# Patient Record
Sex: Female | Born: 1985
Health system: Southern US, Community
[De-identification: ages and names within clinical notes are randomized; demographics above are authoritative.]

## PROBLEM LIST (undated history)

## (undated) DIAGNOSIS — R739 Hyperglycemia, unspecified: Secondary | ICD-10-CM

## (undated) DIAGNOSIS — I10 Essential (primary) hypertension: Secondary | ICD-10-CM

## (undated) HISTORY — PX: NO PAST SURGERIES: SHX2092

## (undated) HISTORY — DX: Hyperglycemia, unspecified: R73.9

---

## 2018-12-26 ENCOUNTER — Other Ambulatory Visit: Payer: Self-pay

## 2018-12-26 ENCOUNTER — Ambulatory Visit (INDEPENDENT_AMBULATORY_CARE_PROVIDER_SITE_OTHER): Payer: No Typology Code available for payment source | Admitting: Family

## 2018-12-26 ENCOUNTER — Encounter: Payer: Self-pay | Admitting: Family

## 2018-12-26 VITALS — Ht 66.0 in | Wt 160.0 lb

## 2018-12-26 DIAGNOSIS — Z3169 Encounter for other general counseling and advice on procreation: Secondary | ICD-10-CM

## 2018-12-26 DIAGNOSIS — R0789 Other chest pain: Secondary | ICD-10-CM | POA: Diagnosis not present

## 2018-12-26 DIAGNOSIS — R030 Elevated blood-pressure reading, without diagnosis of hypertension: Secondary | ICD-10-CM | POA: Diagnosis not present

## 2018-12-26 NOTE — Progress Notes (Signed)
Subjective:    Patient ID: Angela Pollard, female    DOB: 02-Sep-1985, 33 y.o.   MRN: [id number]  HPI   Virtual Visit via Video Note  I connected with Angela Pollard on 12/26/18 at  9:40 AM EDT by a video enabled telemedicine application and verified that I am speaking with the correct person using two identifiers.  Location: Patient: home Provider: office   I discussed the limitations of evaluation and management by telemedicine and the availability of in person appointments. The patient expressed understanding and agreed to proceed.    Patient is a 33 yr old female who presents today to establish care.   She has several concerns today:  Elevated blood pressure- She reports that her bp was high when she checked it at work a while back. Reports + family history of hypertension and she reports that she is ready to take it "more seriously." She has not checked her blood pressure recently.   Also would like to discuss preconception counseling.  She and her partner are considering pregnancy.  She has one son who is 34 years old.  [id number]  No pregnancy complications during her first pregnancy.   Reports that she has occasional chest discomfort. Reports that it last occurred 3-4 days ago. When she experiences the chest discomfort she is usually at rest. It is not worsened by activity. She notes some increased windedness with activity recently but she attributes this to being out of shape and to her recent weight gain. She denies any calf pain/swelling or risk factors for DVT/PE.   Review of Systems See HPI  History reviewed. No pertinent past medical history.   Social History   Socioeconomic History  . Marital status: Unknown    Spouse name: Not on file  . Number of children: Not on file  . Years of education: Not on file  . Highest education level: Not on file  Occupational History  . Not on file  Social Needs  . Financial resource strain: Not on file  . Food insecurity    Worry: Not on file    Inability: Not on file  . Transportation needs    Medical: Not on file    Non-medical: Not on file  Tobacco Use  . Smoking status: Former Smoker    Packs/day: 0.30    Years: 8.00    Pack years: 2.40  Substance and Sexual Activity  . Alcohol use: Not on file    Comment: occasional  . Drug use: Never  . Sexual activity: Yes    Partners: Male  Lifestyle  . Physical activity    Days per week: Not on file    Minutes per session: Not on file  . Stress: Not on file  Relationships  . Social Musicianconnections    Talks on phone: Not on file    Gets together: Not on file    Attends religious service: Not on file    Active member of club or organization: Not on file    Attends meetings of clubs or organizations: Not on file    Relationship status: Not on file  . Intimate partner violence    Fear of current or ex partner: Not on file    Emotionally abused: Not on file    Physically abused: Not on file    Forced sexual activity: Not on file  Other Topics Concern  . 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 No pertinent surgical history.  Family History  Problem Relation Age of Onset  . Brain cancer Mother 25  . Kidney failure Father   . CVA Father   . Hypertension Father   . Diabetes Mellitus II Father   . Hyperlipidemia Father   . Diabetes Mellitus II Half-Sister   . Heart failure Maternal Aunt     Not on File  No current outpatient medications on file prior to visit.   No current facility-administered medications on file prior to visit.     Ht 5\' 6"  (1.676 m)   Wt 160 lb (72.6 kg)   BMI 25.82 kg/m       Objective:   Physical Exam  Gen: Awake, alert, no acute distress Resp: Breathing is even and non-labored Psych:  calm/pleasant demeanor Neuro: Alert and Oriented x 3, + facial symmetry, speech is clear.        Assessment & Plan:  Preconception counseling- I advised her to add a daily prenatal vitamin, ensure healthy diet and regular exercise. I advised her that I would like to check her bp and make sure bp is stable before she considers pregnancy.  Elevated blood pressure- will plan to check in the office next week. If high, plan to initiate labetalol due to plans for conception. Discussed modest weight loss of 10-15 pounds.   Atypical chest pain- plan to perform EKG when she comes into the office. In the meantime she is advised to go to the ER if she develops recurrent/worsening chest pain.   20 minutes spent on today's video visit. >50% of this time was spent counseling pt on preconception counseling, HTN and atypical chest pain.

## 2019-01-02 ENCOUNTER — Ambulatory Visit (INDEPENDENT_AMBULATORY_CARE_PROVIDER_SITE_OTHER): Payer: No Typology Code available for payment source | Admitting: Family

## 2019-01-02 ENCOUNTER — Other Ambulatory Visit (HOSPITAL_COMMUNITY)
Admission: RE | Admit: 2019-01-02 | Discharge: 2019-01-02 | Disposition: A | Discharge status: Home / Self Care | Payer: No Typology Code available for payment source | Source: Ambulatory Visit | Attending: Family | Admitting: Family

## 2019-01-02 ENCOUNTER — Other Ambulatory Visit: Payer: Self-pay

## 2019-01-02 ENCOUNTER — Encounter: Payer: Self-pay | Admitting: Family

## 2019-01-02 VITALS — BP 104/101 | HR 61 | Temp 98.2°F | Resp 16 | Ht 66.0 in | Wt 156.0 lb

## 2019-01-02 DIAGNOSIS — Z01419 Encounter for gynecological examination (general) (routine) without abnormal findings: Secondary | ICD-10-CM

## 2019-01-02 DIAGNOSIS — Z0000 Encounter for general adult medical examination without abnormal findings: Secondary | ICD-10-CM

## 2019-01-02 DIAGNOSIS — I10 Essential (primary) hypertension: Secondary | ICD-10-CM

## 2019-01-02 DIAGNOSIS — R0789 Other chest pain: Secondary | ICD-10-CM | POA: Diagnosis not present

## 2019-01-02 LAB — COMPREHENSIVE METABOLIC PANEL
ALT: 10 U/L (ref 0–35)
AST: 13 U/L (ref 0–37)
Albumin: 4 g/dL (ref 3.5–5.2)
Alkaline Phosphatase: 62 U/L (ref 39–117)
BUN: 18 mg/dL (ref 6–23)
CO2: 30 mEq/L (ref 19–32)
Calcium: 8.9 mg/dL (ref 8.4–10.5)
Chloride: 103 mEq/L (ref 96–112)
Creatinine, Ser: 0.75 mg/dL (ref 0.40–1.20)
GFR: 88.65 mL/min (ref >60.00)
Glucose, Bld: 98 mg/dL (ref 70–99)
Potassium: 4.3 mEq/L (ref 3.5–5.1)
Sodium: 138 mEq/L (ref 135–145)
Total Bilirubin: 0.4 mg/dL (ref 0.2–1.2)
Total Protein: 6.9 g/dL (ref 6.0–8.3)

## 2019-01-02 LAB — CBC
HCT: 39.6 % (ref 36.0–46.0)
Hemoglobin: 13 g/dL (ref 12.0–15.0)
MCHC: 32.8 g/dL (ref 30.0–36.0)
MCV: 88.2 fl (ref 78.0–100.0)
Platelets: 314 10*3/uL (ref 150.0–400.0)
RBC: 4.49 Mil/uL (ref 3.87–5.11)
RDW: 13.6 % (ref 11.5–15.5)
WBC: 6.5 10*3/uL (ref 4.0–10.5)

## 2019-01-02 LAB — TSH: TSH: 1.62 u[IU]/mL (ref 0.35–4.50)

## 2019-01-02 LAB — LIPID PANEL
Cholesterol: 197 mg/dL (ref 0–200)
HDL: 60.2 mg/dL (ref >39.00)
LDL Cholesterol: 126 mg/dL — ABNORMAL HIGH (ref 0–99)
NonHDL: 136.67
Total CHOL/HDL Ratio: 3
Triglycerides: 54 mg/dL (ref 0.0–149.0)
VLDL: 10.8 mg/dL (ref 0.0–40.0)

## 2019-01-02 MED ORDER — LABETALOL HCL 100 MG PO TABS: 100.0000 mg | ORAL_TABLET | Freq: Two times a day (BID) | ORAL | 1 refills | Status: DC

## 2019-01-02 NOTE — Progress Notes (Signed)
Subjective:    Patient ID: Angela Pollard, female    DOB: 1986-05-10, 33 y.o.   MRN: [id number]  HPI  Patient presents today for complete physical.  Immunizations: reports last tetanus shot was given around 2011 Diet: reports diet is healthy Exercise: not exercising as much as she would like Pap Smear: last pap was 3 years ago Dental: due will schedule Vision: due, will schedule  Atypical chest pain- reports that she has intermittent chest pain.  Sometimes in to the right upper back.  Sometimes on the left-hand side.  Describes a "random pain." "moves around."    HTN-she is not currently on an antihypertensive.  She does have family history of hypertension. BP Readings from Last 3 Encounters:  01/02/19 (!) 104/101   She is considering pregnancy.  Review of Systems  Constitutional: Negative for unexpected weight change.  HENT: Negative for rhinorrhea.   Respiratory: Negative for cough and shortness of breath.   Cardiovascular: Negative for leg swelling.       See HPI  Gastrointestinal: Negative for constipation and diarrhea.  Genitourinary: Negative for dysuria, frequency and hematuria.  Musculoskeletal: Negative for arthralgias and myalgias.  Neurological: Negative for headaches.  Hematological: Negative for adenopathy.  Psychiatric/Behavioral:       Denies depression/anxiety   No past medical history on file.   Social History   Socioeconomic History  . Marital status: Unknown    Spouse name: Not on file  . Number of children: Not on file  . Years of education: Not on file  . Highest education level: Not on file  Occupational History  . Not on file  Social Needs  . Financial resource strain: Not on file  . Food insecurity    Worry: Not on file    Inability: Not on file  . Transportation needs    Medical: Not on file    Non-medical: Not on file  Tobacco Use  . Smoking status: Former Smoker    Packs/day: 0.30    Years: 8.00    Pack years: 2.40  Substance  and Sexual Activity  . Alcohol use: Not on file    Comment: occasional  . Drug use: Never  . Sexual activity: Yes    Partners: Male  Lifestyle  . Physical activity    Days per week: Not on file    Minutes per session: Not on file  . Stress: Not on file  Relationships  . Social Herbalist on phone: Not on file    Gets together: Not on file    Attends religious service: Not on file    Active member of club or organization: Not on file    Attends meetings of clubs or organizations: Not on file    Relationship status: Not on file  . Intimate partner violence    Fear of current or ex partner: Not on file    Emotionally abused: Not on file    Physically abused: Not on file    Forced sexual activity: Not on file  Other Topics Concern  . 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  Family History  Problem Relation Age of Onset  . Brain cancer  Mother 1533  . Kidney failure Father   . CVA Father   . Hypertension Father   . Diabetes Mellitus II Father   . Hyperlipidemia Father   . Diabetes Mellitus II Half-Sister   . Heart failure Maternal Aunt     Not on File  Current Outpatient Medications on File Prior to Visit  Medication Sig Dispense Refill  . folic acid (FOLVITE) 0.5 MG tablet Take 0.5 mg by mouth daily.     No current facility-administered medications on file prior to visit.     BP (!) 104/101 (BP Location: Right Arm, Patient Position: Sitting, Cuff Size: Small)   Pulse 61   Temp 98.2 F (36.8 C) (Oral)   Resp 16   Ht 5\' 6"  (1.676 m)   Wt 156 lb (70.8 kg)   SpO2 100%   BMI 25.18 kg/m       Objective:   Physical Exam  Physical Exam  Constitutional: She is oriented to person, place, and time. She appears  well-developed and well-nourished. No distress.  HENT:  Head: Normocephalic and atraumatic.  Right Ear: Tympanic membrane and ear canal normal.  Left Ear: Tympanic membrane and ear canal normal.  Mouth/Throat: Oropharynx is clear and moist.  Eyes: Pupils are equal, round, and reactive to light. No scleral icterus.  Neck: Normal range of motion. No thyromegaly present.  Cardiovascular: Normal rate and regular rhythm.   No murmur heard. Pulmonary/Chest: Effort normal and breath sounds normal. No respiratory distress. He has no wheezes. She has no rales. She exhibits no tenderness.  Abdominal: Soft. Bowel sounds are normal. She exhibits no distension and no mass. There is no tenderness. There is no rebound and no guarding.  Musculoskeletal: She exhibits no edema.  Lymphadenopathy:    She has no cervical adenopathy.  Neurological: She is alert and oriented to person, place, and time. She has normal patellar reflexes. She exhibits normal muscle tone. Coordination normal.  Skin: Skin is warm and dry.  Psychiatric: She has a normal mood and affect. Her behavior is normal. Judgment and thought content normal.  Breasts: Examined lying Right: Without masses, retractions, discharge or axillary adenopathy.  Left: Without masses, retractions, discharge or axillary adenopathy.  Inguinal/mons: Normal without inguinal adenopathy  External genitalia: Normal  BUS/Urethra/Skene's glands: Normal  Bladder: Normal  Vagina: Normal  Cervix: Normal  Uterus: normal in size, shape and contour. Midline and mobile  Adnexa/parametria:  Rt: Without masses or tenderness.  Lt: Without masses or tenderness.  Anus and perineum: Normal            Assessment & Plan:   Preventative care- discussed healthy diet, exercise. Obtain routine lab work. Reports tetanus is up to date.  Pap smear is performed today.  We discussed the importance of a daily prenatal vitamin prior to conception.  Atypical chest pain- EKG  tracing is personally reviewed.  EKG notes NSR.  No acute changes.  Reassurance provided.  HTN- add labetalol 100mg  bid (she is considering pregancy).  Discussed low-sodium diet.  Plan to recheck blood pressure in 1 month.      Assessment & Plan:

## 2019-01-02 NOTE — Patient Instructions (Addendum)
Please add labetalol twice daily for blood pressure. Work on trying to add more regular exercise.

## 2019-01-05 LAB — CYTOLOGY - PAP
Diagnosis: NEGATIVE
HPV: NOT DETECTED

## 2019-01-07 ENCOUNTER — Encounter: Payer: Self-pay | Admitting: Family

## 2019-01-07 ENCOUNTER — Telehealth: Payer: Self-pay | Admitting: Family

## 2019-01-07 MED ORDER — FLUCONAZOLE 150 MG PO TABS: 150.0000 mg | ORAL_TABLET | Freq: Once | ORAL | 0 refills | Status: AC

## 2019-01-07 NOTE — Telephone Encounter (Signed)
Please contact pt and let her know that I reviewed her lab work and blood work looks good. Pap smear is normal except for finding of a yeast infection. Rx sent for diflucan to her pharmacy.

## 2019-01-08 NOTE — Telephone Encounter (Signed)
Unable to reach patient by phone, message sent to patient on MyChart with this information.

## 2019-01-30 ENCOUNTER — Other Ambulatory Visit: Payer: Self-pay

## 2019-01-30 ENCOUNTER — Ambulatory Visit (INDEPENDENT_AMBULATORY_CARE_PROVIDER_SITE_OTHER): Payer: No Typology Code available for payment source | Admitting: Family

## 2019-01-30 ENCOUNTER — Encounter: Payer: Self-pay | Admitting: Family

## 2019-01-30 VITALS — BP 143/98 | HR 57 | Temp 98.4°F | Resp 16 | Wt 155.6 lb

## 2019-01-30 DIAGNOSIS — I10 Essential (primary) hypertension: Secondary | ICD-10-CM

## 2019-01-30 MED ORDER — NIFEDIPINE ER OSMOTIC RELEASE 30 MG PO TB24: 30.0000 mg | ORAL_TABLET | Freq: Every day | ORAL | 3 refills | Status: DC

## 2019-01-30 NOTE — Patient Instructions (Signed)
Please add procardia once daily. Continue labetalol. Check blood pressure and heart rate once daily for 1 week then send me your readings via mychart.

## 2019-01-30 NOTE — Progress Notes (Signed)
Subjective:    Patient ID: Angela Pollard, female    DOB: [birth year], 33 y.o.   MRN: [id number]  HPI    Patient is a 33 yr old female who presents today for follow up of her blood pressure.  HTN- currently maintained on labetalol. Trying to conceive.  Patient reports that she has been feeling tired.   Notes that she checked her blood pressure once 135/95 on 7/14.   BP Readings from Last 3 Encounters:  01/30/19 (!) 143/98  01/02/19 (!) 104/101      Review of Systems See HPI  No past medical history on file.   Social History   Socioeconomic History  . Marital status: Divorced    Spouse name: Not on file  . Number of children: Not on file  . Years of education: Not on file  . Highest education level: Not on file  Occupational History  . Not on file  Social Needs  . Financial resource strain: Not on file  . Food insecurity    Worry: Not on file    Inability: Not on file  . Transportation needs    Medical: Not on file    Non-medical: Not on file  Tobacco Use  . Smoking status: Former Smoker    Packs/day: 0.30    Years: 8.00    Pack years: 2.40  . Smokeless tobacco: Never Used  Substance and Sexual Activity  . Alcohol use: Not Currently    Comment: occasional  . Drug use: Never  . Sexual activity: Yes    Partners: Male  Lifestyle  . Physical activity    Days per week: Not on file    Minutes per session: Not on file  . Stress: Not on file  Relationships  . Social Herbalist on phone: Not on file    Gets together: Not on file    Attends religious service: Not on file    Active member of club or organization: Not on file    Attends meetings of clubs or organizations: Not on file    Relationship status: Not on file  . Intimate partner violence    Fear of current or ex partner: Not on file    Emotionally abused: Not on file    Physically abused: Not on file    Forced sexual activity: Not on file  Other Topics Concern  . 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  Family History  Problem Relation Age of Onset  . Brain cancer Mother 36  . Kidney failure Father   . CVA Father   . Hypertension Father   . Diabetes Mellitus II Father   . Hyperlipidemia Father   . Diabetes Mellitus II Half-Sister   . Heart failure Maternal Aunt     Not on File  Current Outpatient Medications on File Prior to Visit  Medication Sig Dispense Refill  . folic acid (FOLVITE) 0.5 MG tablet Take 0.5 mg by mouth daily.    Marland Kitchen labetalol (NORMODYNE) 100 MG tablet Take 1 tablet (100 mg total) by mouth 2 (two) times daily. 60 tablet 1   No current facility-administered medications on file prior  to visit.     BP (!) 143/98 (BP Location: Right Arm, Patient Position: Sitting, Cuff Size: Small)   Pulse (!) 57   Temp 98.4 F (36.9 C) (Oral)   Resp 16   Wt 155 lb 9.6 oz (70.6 kg)   SpO2 100%   BMI 25.11 kg/m       Objective:   Physical Exam Constitutional:      Appearance: She is well-developed.  Neck:     Musculoskeletal: Neck supple.     Thyroid: No thyromegaly.  Cardiovascular:     Rate and Rhythm: Normal rate and regular rhythm.     Heart sounds: Normal heart sounds. No murmur.  Pulmonary:     Effort: Pulmonary effort is normal. No respiratory distress.     Breath sounds: Normal breath sounds. No wheezing.  Skin:    General: Skin is warm and dry.  Neurological:     Mental Status: She is alert and oriented to person, place, and time.  Psychiatric:        Behavior: Behavior normal.        Thought Content: Thought content normal.        Judgment: Judgment normal.           Assessment & Plan:  HTN- bp remains above goal.  Continue labetalol, add procardia low dose. Pt is  advised to check bp and heart rated daily x 1 week and send me her readings via mychart. She is advised to let me know if HR<55.

## 2019-02-02 ENCOUNTER — Encounter: Payer: Self-pay | Admitting: Family

## 2019-02-05 ENCOUNTER — Encounter: Payer: Self-pay | Admitting: Family

## 2019-02-13 ENCOUNTER — Encounter: Payer: Self-pay | Admitting: Family

## 2019-02-13 MED ORDER — NIFEDIPINE ER OSMOTIC RELEASE 60 MG PO TB24: 60.0000 mg | ORAL_TABLET | Freq: Every day | ORAL | 0 refills | Status: DC

## 2019-03-06 ENCOUNTER — Other Ambulatory Visit: Payer: Self-pay | Admitting: Family

## 2019-05-04 ENCOUNTER — Other Ambulatory Visit: Payer: Self-pay

## 2019-05-04 ENCOUNTER — Encounter: Payer: Self-pay | Admitting: Family

## 2019-05-04 ENCOUNTER — Ambulatory Visit (INDEPENDENT_AMBULATORY_CARE_PROVIDER_SITE_OTHER): Payer: No Typology Code available for payment source | Admitting: Family

## 2019-05-04 VITALS — BP 124/92 | HR 62 | Ht 66.0 in | Wt 155.0 lb

## 2019-05-04 DIAGNOSIS — I10 Essential (primary) hypertension: Secondary | ICD-10-CM

## 2019-05-04 MED ORDER — NIFEDIPINE ER OSMOTIC RELEASE 60 MG PO TB24: 60.0000 mg | ORAL_TABLET | Freq: Every day | ORAL | 1 refills | Status: DC

## 2019-05-04 MED ORDER — LABETALOL HCL 100 MG PO TABS: 100.0000 mg | ORAL_TABLET | Freq: Two times a day (BID) | ORAL | 1 refills | Status: DC

## 2019-05-04 NOTE — Progress Notes (Signed)
Virtual Visit via Video Note  I connected with Angela Pollard on 05/04/19 at  8:20 AM EST by a video enabled telemedicine application and verified that I am speaking with the correct person using two identifiers.  Location: Patient: home Provider: office   I discussed the limitations of evaluation and management by telemedicine and the availability of in person appointments. The patient expressed understanding and agreed to proceed.  History of Present Illness:  HTN- maintained on labetalol and procardia.  Reports BP today is higher that it has been typically. Reports that she is compliant with medication, though has not yet taken her AM dose today. Reports HR has been mostly in the 70-80's.   BP Readings from Last 3 Encounters:  05/04/19 (!) 124/92  01/30/19 (!) 143/98  01/02/19 (!) 104/101   No past medical history on file.   Social History   Socioeconomic History  . Marital status: Divorced    Spouse name: Not on file  . Number of children: Not on file  . Years of education: Not on file  . Highest education level: Not on file  Occupational History  . Not on file  Social Needs  . Financial resource strain: Not on file  . Food insecurity    Worry: Not on file    Inability: Not on file  . Transportation needs    Medical: Not on file    Non-medical: Not on file  Tobacco Use  . Smoking status: Former Smoker    Packs/day: 0.30    Years: 8.00    Pack years: 2.40  . Smokeless tobacco: Never Used  Substance and Sexual Activity  . Alcohol use: Not Currently    Comment: occasional  . Drug use: Never  . Sexual activity: Yes    Partners: Male  Lifestyle  . Physical activity    Days per week: Not on file    Minutes per session: Not on file  . Stress: Not on file  Relationships  . Social Musician on phone: Not on file    Gets together: Not on file    Attends religious service: Not on file    Active member of club or organization: Not on file    Attends  meetings of clubs or organizations: Not on file    Relationship status: Not on file  . Intimate partner violence    Fear of current or ex partner: Not on file    Emotionally abused: Not on file    Physically abused: Not on file    Forced sexual activity: Not on file  Other Topics Concern  . 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  Family History  Problem Relation Age of Onset  . Brain cancer Mother 43  . Kidney failure Father   . CVA Father   . Hypertension Father   . Diabetes Mellitus II Father   . Hyperlipidemia Father   . Diabetes Mellitus II Half-Sister   . Heart failure Maternal Aunt     Not on File  Current Outpatient Medications on File Prior to Visit  Medication Sig Dispense Refill  . folic acid (FOLVITE) 0.5 MG tablet Take  0.5 mg by mouth daily.     No current facility-administered medications on file prior to visit.     BP (!) 124/92 (BP Location: Left Arm, Patient Position: Sitting, Cuff Size: Small)   Pulse 62   Ht 5\' 6"  (1.676 m)   Wt 155 lb (70.3 kg)   BMI 25.02 kg/m        Observations/Objective:   Gen: Awake, alert, no acute distress Resp: Breathing is even and non-labored Psych: calm/pleasant demeanor Neuro: Alert and Oriented x 3, + facial symmetry, speech is clear.   Assessment and Plan:  HTN- bp improving.  I advised her to continue current meds/doses and to call if HR running <60 or BP running >140/90.  Pt verbalizes understanding.   Follow Up Instructions:    I discussed the assessment and treatment plan with the patient. The patient was provided an opportunity to ask questions and all were answered. The patient agreed with the plan and demonstrated an understanding of the  instructions.   The patient was advised to call back or seek an in-person evaluation if the symptoms worsen or if the condition fails to improve as anticipated.  [name], NP

## 2019-05-09 ENCOUNTER — Other Ambulatory Visit: Payer: Self-pay

## 2019-05-09 ENCOUNTER — Telehealth: Payer: No Typology Code available for payment source | Admitting: Nurse Practitioner

## 2019-05-09 DIAGNOSIS — J029 Acute pharyngitis, unspecified: Secondary | ICD-10-CM

## 2019-05-09 DIAGNOSIS — Z20822 Contact with and (suspected) exposure to covid-19: Secondary | ICD-10-CM

## 2019-05-09 DIAGNOSIS — Z20828 Contact with and (suspected) exposure to other viral communicable diseases: Secondary | ICD-10-CM

## 2019-05-09 DIAGNOSIS — R519 Headache, unspecified: Secondary | ICD-10-CM

## 2019-05-09 DIAGNOSIS — K624 Stenosis of anus and rectum: Secondary | ICD-10-CM

## 2019-05-09 DIAGNOSIS — R059 Cough, unspecified: Secondary | ICD-10-CM

## 2019-05-09 DIAGNOSIS — R5081 Fever presenting with conditions classified elsewhere: Secondary | ICD-10-CM

## 2019-05-09 DIAGNOSIS — R05 Cough: Secondary | ICD-10-CM

## 2019-05-09 MED ORDER — BENZONATATE 100 MG PO CAPS: 100.0000 mg | ORAL_CAPSULE | Freq: Three times a day (TID) | ORAL | 0 refills | Status: DC | PRN

## 2019-05-09 NOTE — Progress Notes (Signed)
E-Visit for Corona Virus Screening   Your current symptoms could be consistent with the coronavirus.  Many health care providers can now test patients at their office but not all are.  [facility] has multiple testing sites. For information on our COVID testing locations and hours go to [email or web address]  Please quarantine yourself while awaiting your test results.  We are enrolling you in our MyChart Home Montioring for COVID19 . Daily you will receive a questionnaire within the MyChart website. Our COVID 19 response team willl be monitoriing your responses daily.  You can go to one of the  testing sites listed below, while they are opened (see hours). You do not need a doctors order to be tested for covid.You do need to self-isolate until your results return and if positive 14 days from when your symptoms started and until you are 3 days symptom free.   Testing Locations (Monday - Friday, 10a.m. - 3:30 p.m.)   [place]: [facility] Entrance), [address], [place], [place] -   [place]: [facility] [place] [address], [address] [place], [place] (entrance off [employer]) -   [place] (Closed each Monday): [address] [place], [place] - the short stay covered drive at [facility] (Use the [employer] entrance to [facility] next to [facility].) -    COVID-19 is a respiratory illness with symptoms that are similar to the flu. Symptoms are typically mild to moderate, but there have been cases of severe illness and death due to the virus. The following symptoms may appear 2-14 days after exposure: . Fever . Cough . Shortness of breath or difficulty breathing . Chills . Repeated shaking with chills . Muscle pain . Headache . Sore throat . New loss of taste or smell . Fatigue . Congestion or runny nose . Nausea or vomiting . Diarrhea  It is vitally  important that if you feel that you have an infection such as this virus or any other virus that you stay home and away from places where you may spread it to others.  You should self-quarantine for 14 days if you have symptoms that could potentially be coronavirus or have been in close contact a with a person diagnosed with COVID-19 within the last 2 weeks. You should avoid contact with people age 65 and older.   You should wear a mask or cloth face covering over your nose and mouth if you must be around other people or animals, including pets (even at home). Try to stay at least 6 feet away from other people. This will protect the people around you.  You can use medication such as robitussin OTC if you think you could be [regnant  You may also take acetaminophen (Tylenol) as needed for fever.   Reduce your risk of any infection by using the same precautions used for avoiding the common cold or flu:  Marland Kitchen Wash your hands often with soap and warm water for at least 20 seconds.  If soap and water are not readily available, use an alcohol-based hand sanitizer with at least 60% alcohol.  . If coughing or sneezing, cover your mouth and nose by coughing or sneezing into the elbow areas of your shirt or coat, into a tissue or into your sleeve (not your hands). . Avoid shaking hands with others and consider head nods or verbal greetings only. . Avoid touching your eyes, nose, or mouth with unwashed hands.  Marland Kitchen  Avoid close contact with people who are sick. . Avoid places or events with large numbers of people in one location, like concerts or sporting events. . Carefully consider travel plans you have or are making. . If you are planning any travel outside or inside the [place], visit the CDC's Travelers' Health webpage for the latest health notices. . If you have some symptoms but not all symptoms, continue to monitor at home and seek medical attention if your symptoms worsen. . If you are having a medical  emergency, call 911.  HOME CARE . Only take medications as instructed by your medical team. . Drink plenty of fluids and get plenty of rest. . A steam or ultrasonic humidifier can help if you have congestion.   GET HELP RIGHT AWAY IF YOU HAVE EMERGENCY WARNING SIGNS** FOR COVID-19. If you or someone is showing any of these signs seek emergency medical care immediately. Call 911 or proceed to your closest emergency facility if: . You develop worsening high fever. . Trouble breathing . Bluish lips or face . Persistent pain or pressure in the chest . New confusion . Inability to wake or stay awake . You cough up blood. . Your symptoms become more severe  **This list is not all possible symptoms. Contact your medical provider for any symptoms that are sever or concerning to you.   MAKE SURE YOU   Understand these instructions.  Will watch your condition.  Will get help right away if you are not doing well or get worse.  Your e-visit answers were reviewed by a board certified advanced clinical practitioner to complete your personal care plan.  Depending on the condition, your plan could have included both over the counter or prescription medications.  If there is a problem please reply once you have received a response from your provider.  Your safety is important to [place].  If you have drug allergies check your prescription carefully.    You can use MyChart to ask questions about today's visit, request a non-urgent call back, or ask for a work or school excuse for 24 hours related to this e-Visit. If it has been greater than 24 hours you will need to follow up with your provider, or enter a new e-Visit to address those concerns. You will get an e-mail in the next two days asking about your experience.  I hope that your e-visit has been valuable and will speed your recovery. Thank you for using e-visits.   5-10 minutes spent reviewing and documenting in chart.

## 2019-05-12 LAB — NOVEL CORONAVIRUS, NAA: SARS-CoV-2, NAA: DETECTED — AB

## 2019-08-06 ENCOUNTER — Encounter: Payer: Self-pay | Admitting: Family

## 2019-09-13 ENCOUNTER — Encounter: Payer: Self-pay | Admitting: Family

## 2019-09-13 DIAGNOSIS — N979 Female infertility, unspecified: Secondary | ICD-10-CM

## 2019-09-13 NOTE — Telephone Encounter (Signed)
Could you please check to see if any of our labs will process an AMH level?

## 2019-09-19 NOTE — Telephone Encounter (Signed)
[facility] offers this for female and female. [facility] Female test # [phone].

## 2019-09-25 ENCOUNTER — Other Ambulatory Visit: Payer: Self-pay

## 2019-09-25 ENCOUNTER — Other Ambulatory Visit (INDEPENDENT_AMBULATORY_CARE_PROVIDER_SITE_OTHER): Payer: No Typology Code available for payment source

## 2019-09-25 DIAGNOSIS — N979 Female infertility, unspecified: Secondary | ICD-10-CM

## 2019-09-28 LAB — ANTI-MULLERIAN HORMONE (AMH), FEMALE: Anti-Mullerian Hormones(AMH), Female: 5.91 ng/mL (ref 0.36–10.07)

## 2019-10-01 ENCOUNTER — Encounter: Payer: Self-pay | Admitting: Family

## 2019-12-27 ENCOUNTER — Encounter (HOSPITAL_BASED_OUTPATIENT_CLINIC_OR_DEPARTMENT_OTHER): Payer: Self-pay

## 2019-12-27 ENCOUNTER — Other Ambulatory Visit: Payer: Self-pay

## 2019-12-27 ENCOUNTER — Emergency Department (HOSPITAL_BASED_OUTPATIENT_CLINIC_OR_DEPARTMENT_OTHER)
Admission: EM | Admit: 2019-12-27 | Discharge: 2019-12-27 | Disposition: A | Discharge status: Home / Self Care | Payer: No Typology Code available for payment source | Attending: Emergency Medicine | Admitting: Emergency Medicine

## 2019-12-27 DIAGNOSIS — I10 Essential (primary) hypertension: Secondary | ICD-10-CM | POA: Diagnosis not present

## 2019-12-27 DIAGNOSIS — M549 Dorsalgia, unspecified: Secondary | ICD-10-CM | POA: Diagnosis not present

## 2019-12-27 DIAGNOSIS — R109 Unspecified abdominal pain: Secondary | ICD-10-CM | POA: Insufficient documentation

## 2019-12-27 DIAGNOSIS — Z87891 Personal history of nicotine dependence: Secondary | ICD-10-CM | POA: Insufficient documentation

## 2019-12-27 DIAGNOSIS — Z79899 Other long term (current) drug therapy: Secondary | ICD-10-CM | POA: Diagnosis not present

## 2019-12-27 HISTORY — DX: Essential (primary) hypertension: I10

## 2019-12-27 LAB — COMPREHENSIVE METABOLIC PANEL
ALT: 16 U/L (ref 0–44)
AST: 19 U/L (ref 15–41)
Albumin: 3.8 g/dL (ref 3.5–5.0)
Alkaline Phosphatase: 58 U/L (ref 38–126)
Anion gap: 7 (ref 5–15)
BUN: 12 mg/dL (ref 6–20)
CO2: 26 mmol/L (ref 22–32)
Calcium: 8.3 mg/dL — ABNORMAL LOW (ref 8.9–10.3)
Chloride: 103 mmol/L (ref 98–111)
Creatinine, Ser: 0.6 mg/dL (ref 0.44–1.00)
GFR calc Af Amer: >60 mL/min (ref >60)
GFR calc non Af Amer: >60 mL/min (ref >60)
Glucose, Bld: 98 mg/dL (ref 70–99)
Potassium: 3.8 mmol/L (ref 3.5–5.1)
Sodium: 136 mmol/L (ref 135–145)
Total Bilirubin: 0.7 mg/dL (ref 0.3–1.2)
Total Protein: 7.2 g/dL (ref 6.5–8.1)

## 2019-12-27 LAB — CBC WITH DIFFERENTIAL/PLATELET
Abs Immature Granulocytes: 0.02 10*3/uL (ref 0.00–0.07)
Basophils Absolute: 0.1 10*3/uL (ref 0.0–0.1)
Basophils Relative: 1 %
Eosinophils Absolute: 0.2 10*3/uL (ref 0.0–0.5)
Eosinophils Relative: 2 %
HCT: 38.2 % (ref 36.0–46.0)
Hemoglobin: 12.5 g/dL (ref 12.0–15.0)
Immature Granulocytes: 0 %
Lymphocytes Relative: 33 %
Lymphs Abs: 2.8 10*3/uL (ref 0.7–4.0)
MCH: 28.7 pg (ref 26.0–34.0)
MCHC: 32.7 g/dL (ref 30.0–36.0)
MCV: 87.8 fL (ref 80.0–100.0)
Monocytes Absolute: 0.6 10*3/uL (ref 0.1–1.0)
Monocytes Relative: 7 %
Neutro Abs: 4.7 10*3/uL (ref 1.7–7.7)
Neutrophils Relative %: 57 %
Platelets: 320 10*3/uL (ref 150–400)
RBC: 4.35 MIL/uL (ref 3.87–5.11)
RDW: 12.7 % (ref 11.5–15.5)
WBC: 8.5 10*3/uL (ref 4.0–10.5)
nRBC: 0 % (ref 0.0–0.2)

## 2019-12-27 LAB — PREGNANCY, URINE: Preg Test, Ur: NEGATIVE

## 2019-12-27 LAB — URINALYSIS, ROUTINE W REFLEX MICROSCOPIC
Bilirubin Urine: NEGATIVE
Glucose, UA: NEGATIVE mg/dL
Hgb urine dipstick: NEGATIVE
Ketones, ur: NEGATIVE mg/dL
Leukocytes,Ua: NEGATIVE
Nitrite: NEGATIVE
Protein, ur: NEGATIVE mg/dL
Specific Gravity, Urine: 1.015 (ref 1.005–1.030)
pH: 7.5 (ref 5.0–8.0)

## 2019-12-27 LAB — LIPASE, BLOOD: Lipase: 23 U/L (ref 11–51)

## 2019-12-27 MED ORDER — KETOROLAC TROMETHAMINE 30 MG/ML IJ SOLN
30.0000 mg | Freq: Once | INTRAMUSCULAR | Status: AC
  Administered 2019-12-27: 30 mg via INTRAVENOUS
  Filled 2019-12-27: qty 1

## 2019-12-27 MED ORDER — CYCLOBENZAPRINE HCL 10 MG PO TABS
10.0000 mg | ORAL_TABLET | Freq: Once | ORAL | Status: AC
  Administered 2019-12-27: 10 mg via ORAL
  Filled 2019-12-27: qty 1

## 2019-12-27 MED ORDER — CYCLOBENZAPRINE HCL 10 MG PO TABS: 10.0000 mg | ORAL_TABLET | Freq: Three times a day (TID) | ORAL | 0 refills | Status: AC

## 2019-12-27 NOTE — ED Provider Notes (Signed)
[facility] EMERGENCY DEPARTMENT Provider Note   CSN: [id number] Arrival date & time: 12/27/19  1931     History Chief Complaint  Patient presents with  . Flank Pain    Angela Pollard is a 34 y.o. female.  34 y.o female with a PMH of HTN (non compliant with medication) presents to the ED with a chief complaint of left flank pain x this morning. Pain is of sudden onset this morning, described as a intermittent dull sensation localized to the left flank with radiation down her back. Pain is exacerbated with movement such as twisting.  Pain is somewhat alleviated with lying still.  She has not taken any medication for improvement in symptoms.  Patient does report she ran a [place] race over the weekend on Sunday, has managed to work Monday, Tuesday, Wednesday however pain suddenly came today.  No prior history of kidney stones, no nausea, vomiting, fever.  No prior surgical intervention to her abdomen. No chest pain, shortness of breath or Trauma.     The history is provided by the patient.  Flank Pain This is a new problem. Pertinent negatives include no chest pain, no abdominal pain, no headaches and no shortness of breath.       Past Medical History:  Diagnosis Date  . Hypertension     Patient Active Problem List   Diagnosis Date Noted  . Essential hypertension 01/02/2019    History reviewed. No pertinent surgical history.   OB History   No obstetric history on file.     Family History  Problem Relation Age of Onset  . Brain cancer Mother 37  . Kidney failure Father   . CVA Father   . Hypertension Father   . Diabetes Mellitus II Father   . Hyperlipidemia Father   . Diabetes Mellitus II Half-Sister   . Heart failure Maternal Aunt     Social History   Tobacco Use  . Smoking status: Former Smoker    Packs/day: 0.30    Years: 8.00    Pack years: 2.40  . Smokeless tobacco: Never Used  Vaping Use  . Vaping Use: Never used  Substance Use Topics  .  Alcohol use: Not Currently    Comment: occasional  . Drug use: Never    Home Medications Prior to Admission medications   Medication Sig Start Date End Date Taking? Authorizing Provider  cyclobenzaprine (FLEXERIL) 10 MG tablet Take 1 tablet (10 mg total) by mouth 3 (three) times daily for 7 days. 12/27/19 01/03/20  Claude Manges, PA-C  folic acid (FOLVITE) 0.5 MG tablet Take 0.5 mg by mouth daily.    [provider]  labetalol (NORMODYNE) 100 MG tablet Take 1 tablet (100 mg total) by mouth 2 (two) times daily. 05/04/19   Sandford Craze, NP  NIFEdipine (PROCARDIA XL/NIFEDICAL XL) 60 MG 24 hr tablet Take 1 tablet (60 mg total) by mouth daily. 05/04/19   Sandford Craze, NP    Allergies    Patient has no known allergies.  Review of Systems   Review of Systems  Constitutional: Negative for chills and fever.  HENT: Negative for sore throat.   Respiratory: Negative for shortness of breath.   Cardiovascular: Negative for chest pain.  Gastrointestinal: Negative for abdominal pain, nausea and vomiting.  Genitourinary: Positive for flank pain. Negative for difficulty urinating, dysuria and hematuria.  Musculoskeletal: Negative for back pain.  Neurological: Negative for light-headedness and headaches.  All other systems reviewed and are negative.   Physical Exam  Updated Vital Signs BP (!) 140/98 (BP Location: Right Arm)   Pulse 65   Temp 98.9 F (37.2 C) (Oral)   Resp 16   Ht 5\' 6"  (1.676 m)   Wt 75.3 kg   LMP 12/16/2019   SpO2 98%   BMI 26.79 kg/m   Physical Exam Vitals and nursing note reviewed.  Constitutional:      Appearance: Normal appearance. She is not ill-appearing or toxic-appearing.     Comments: Non ill , non toxic.   HENT:     Head: Normocephalic and atraumatic.     Nose: Nose normal.     Mouth/Throat:     Mouth: Mucous membranes are moist.  Eyes:     Pupils: Pupils are equal, round, and reactive to light.  Cardiovascular:     Rate and Rhythm:  Normal rate.     Pulses:          Radial pulses are 2+ on the right side and 2+ on the left side.       Dorsalis pedis pulses are 2+ on the right side and 2+ on the left side.  Pulmonary:     Effort: Pulmonary effort is normal. No accessory muscle usage or prolonged expiration.     Breath sounds: Normal breath sounds. No decreased breath sounds, wheezing or rales.       Comments: Lungs are clear to auscultation without any wheezing, rhonchi, rales. Abdominal:     General: Abdomen is flat.     Palpations: Abdomen is soft.     Tenderness: There is no abdominal tenderness. There is no right CVA tenderness or left CVA tenderness.     Comments: Abdomen is soft, nontender to palpation, no guarding.  No bilateral CVA tenderness.  Musculoskeletal:     Cervical back: Normal range of motion and neck supple.  Skin:    General: Skin is warm and dry.  Neurological:     Mental Status: She is alert and oriented to person, place, and time.     ED Results / Procedures / Treatments   Labs (all labs ordered are listed, but only abnormal results are displayed) Labs Reviewed  COMPREHENSIVE METABOLIC PANEL - Abnormal; Notable for the following components:      Result Value   Calcium 8.3 (*)    All other components within normal limits  URINALYSIS, ROUTINE W REFLEX MICROSCOPIC  PREGNANCY, URINE  LIPASE, BLOOD  CBC WITH DIFFERENTIAL/PLATELET    EKG None  Radiology No results found.  Procedures Procedures (including critical care time)  Medications Ordered in ED Medications  cyclobenzaprine (FLEXERIL) tablet 10 mg (10 mg Oral Given 12/27/19 2237)  ketorolac (TORADOL) 30 MG/ML injection 30 mg (30 mg Intravenous Given 12/27/19 2237)    ED Course  I have reviewed the triage vital signs and the nursing notes.  Pertinent labs & imaging results that were available during my care of the patient were reviewed by me and considered in my medical decision making (see chart for details).  Clinical  Course as of Dec 27 2242  Thu Dec 27, 2019  2236 Preg Test, Ur: NEGATIVE [JS]    Clinical Course User Index [JS] 2237, PA-C   MDM Rules/Calculators/A&P  Patient with a past medical history of hypertension presents to the ED with a chief complaint of left flank pain of sudden onset this morning.  Reports his pain is exacerbated with movement, feels like a pulling sensation down her spine.  She did dissipate in a spine raise  over the weekend, reports she was able to work for the past 3 days, not recall any injury.  Has had no trauma.  No prior history of kidney stones.  During evaluation patient is overall well-appearing, nontoxic, vitals are within normal limits aside from she is hypertensive, systolics in the 140s with a diastolic in the 160s, she is supposed to be on labetalol along with nifedipine for blood pressure control however reports no compliance with medication.  During evaluation there is no palpable pain with palpation of the cervical spine, thoracic spine, lumbar spine.  No CVA tenderness bilaterally, no left flank pain reproducible with palpation of my exam.  No visible rashes.  Abdomen is soft, nontender to palpation.  Lungs are clear to auscultation without any wheezing, rhonchi, rales.  She is steady at 100% on room air.  Pressure diagnoses included but not limited to ureterolithiasis, MSK, versus PE.   Interpreation of her labs revealed a CBC without any leukocytosis, hemoglobin is stable.  Lipase level is unremarkable, does not report any history of heavy alcohol intake, no prior history of pancreatitis.Marland Kitchen  CMP without any electrolyte derangement, creatinine function is within normal limits.  LFTs are normal.    Results of our laboratory were discussed with patient at length, we discussed treatment with muscle relaxers along with anti-inflammatory that she will receive in the ED such as Toradol.  Pressure diagnoses considered were also ureterolithiasis, however unlikely as  pain does not radiate across her left flank, there is no materia present on UA.  Pulmonary embolism also also consider, patient is not a smoker, currently not on any OCPs, no tachycardia,PERC negative.   Patient go home with a prescription for Flexeril 3 times daily, she is encouraged to apply heat to the area.  She is to return to emergency department if she presented any fever, nausea, vomiting or worsening symptoms.  Patient understands and agrees with management, return precautions discussed at length.   Portions of this note were generated with Scientist, clinical (histocompatibility and immunogenetics). Dictation errors may occur despite best attempts at proofreading.  Final Clinical Impression(s) / ED Diagnoses Final diagnoses:  Left flank pain    Rx / DC Orders ED Discharge Orders         Ordered    cyclobenzaprine (FLEXERIL) 10 MG tablet  3 times daily     Discontinue  Reprint     12/27/19 2239           Claude Manges, PA-C 12/27/19 2244    Little, Ambrose [place], MD 12/31/19 [phone]

## 2019-12-27 NOTE — ED Triage Notes (Signed)
Pt c/o left flank pain x today-NAD-steady gait

## 2019-12-27 NOTE — Discharge Instructions (Addendum)
Your laboratory results were within normal limits today, discussed this at length.  I have provided a short prescription for muscle relaxers to help your likely musculoskeletal strain, please take 1 tablet 3 times a day to help with symptoms.  Please be aware this medication can cause drowsiness, not drink alcohol or drive while taking this medication.  If you experience any fever, worsening symptoms, shortness of breath please return to the emergency department immediately.

## 2020-01-19 ENCOUNTER — Encounter: Payer: Self-pay | Admitting: Family

## 2020-01-19 DIAGNOSIS — Z3169 Encounter for other general counseling and advice on procreation: Secondary | ICD-10-CM

## 2020-02-20 ENCOUNTER — Ambulatory Visit (INDEPENDENT_AMBULATORY_CARE_PROVIDER_SITE_OTHER): Payer: No Typology Code available for payment source | Admitting: Family Medicine

## 2020-02-20 ENCOUNTER — Encounter: Payer: Self-pay | Admitting: Family Medicine

## 2020-02-20 ENCOUNTER — Other Ambulatory Visit: Payer: Self-pay

## 2020-02-20 VITALS — BP 134/87 | HR 67 | Ht 66.0 in | Wt 167.0 lb

## 2020-02-20 DIAGNOSIS — I10 Essential (primary) hypertension: Secondary | ICD-10-CM

## 2020-02-20 DIAGNOSIS — N979 Female infertility, unspecified: Secondary | ICD-10-CM | POA: Insufficient documentation

## 2020-02-20 HISTORY — DX: Female infertility, unspecified: N97.9

## 2020-02-20 NOTE — Patient Instructions (Addendum)
 Preventive Care 21-35 Years Old, Female Preventive care refers to visits with your health care provider and lifestyle choices that can promote health and wellness. This includes:  A yearly physical exam. This may also be called an annual well check.  Regular dental visits and eye exams.  Immunizations.  Screening for certain conditions.  Healthy lifestyle choices, such as eating a healthy diet, getting regular exercise, not using drugs or products that contain nicotine and tobacco, and limiting alcohol use. What can I expect for my preventive care visit? Physical exam Your health care provider will check your:  Height and weight. This may be used to calculate body mass index (BMI), which tells if you are at a healthy weight.  Heart rate and blood pressure.  Skin for abnormal spots. Counseling Your health care provider may ask you questions about your:  Alcohol, tobacco, and drug use.  Emotional well-being.  Home and relationship well-being.  Sexual activity.  Eating habits.  Work and work environment.  Method of birth control.  Menstrual cycle.  Pregnancy history. What immunizations do I need?  Influenza (flu) vaccine  This is recommended every year. Tetanus, diphtheria, and pertussis (Tdap) vaccine  You may need a Td booster every 10 years. Varicella (chickenpox) vaccine  You may need this if you have not been vaccinated. Human papillomavirus (HPV) vaccine  If recommended by your health care provider, you may need three doses over 6 months. Measles, mumps, and rubella (MMR) vaccine  You may need at least one dose of MMR. You may also need a second dose. Meningococcal conjugate (MenACWY) vaccine  One dose is recommended if you are age 19-21 years and a first-year college student living in a residence hall, or if you have one of several medical conditions. You may also need additional booster doses. Pneumococcal conjugate (PCV13) vaccine  You may need  this if you have certain conditions and were not previously vaccinated. Pneumococcal polysaccharide (PPSV23) vaccine  You may need one or two doses if you smoke cigarettes or if you have certain conditions. Hepatitis A vaccine  You may need this if you have certain conditions or if you travel or work in places where you may be exposed to hepatitis A. Hepatitis B vaccine  You may need this if you have certain conditions or if you travel or work in places where you may be exposed to hepatitis B. Haemophilus influenzae type b (Hib) vaccine  You may need this if you have certain conditions. You may receive vaccines as individual doses or as more than one vaccine together in one shot (combination vaccines). Talk with your health care provider about the risks and benefits of combination vaccines. What tests do I need?  Blood tests  Lipid and cholesterol levels. These may be checked every 5 years starting at age 20.  Hepatitis C test.  Hepatitis B test. Screening  Diabetes screening. This is done by checking your blood sugar (glucose) after you have not eaten for a while (fasting).  Sexually transmitted disease (STD) testing.  BRCA-related cancer screening. This may be done if you have a family history of breast, ovarian, tubal, or peritoneal cancers.  Pelvic exam and Pap test. This may be done every 3 years starting at age 21. Starting at age 30, this may be done every 5 years if you have a Pap test in combination with an HPV test. Talk with your health care provider about your test results, treatment options, and if necessary, the need for more   tests. Follow these instructions at home: Eating and drinking   Eat a diet that includes fresh fruits and vegetables, whole grains, lean protein, and low-fat dairy.  Take vitamin and mineral supplements as recommended by your health care provider.  Do not drink alcohol if: ? Your health care provider tells you not to drink. ? You are  pregnant, may be pregnant, or are planning to become pregnant.  If you drink alcohol: ? Limit how much you have to 0-1 drink a day. ? Be aware of how much alcohol is in your drink. In the [place] one drink equals one 12 oz bottle of beer (355 mL), one 5 oz glass of wine (148 mL), or one 1 oz glass of hard liquor (44 mL). Lifestyle  Take daily care of your teeth and gums.  Stay active. Exercise for at least 30 minutes on 5 or more days each week.  Do not use any products that contain nicotine or tobacco, such as cigarettes, e-cigarettes, and chewing tobacco. If you need help quitting, ask your health care provider.  If you are sexually active, practice safe sex. Use a condom or other form of birth control (contraception) in order to prevent pregnancy and STIs (sexually transmitted infections). If you plan to become pregnant, see your health care provider for a preconception visit. What's next?  Visit your health care provider once a year for a well check visit.  Ask your health care provider how often you should have your eyes and teeth checked.  Stay up to date on all vaccines. This information is not intended to replace advice given to you by your health care provider. Make sure you discuss any questions you have with your health care provider. Document Revised: 02/23/2018 Document Reviewed: 02/23/2018 Elsevier Patient Education  2020 Elsevier Inc.  

## 2020-02-20 NOTE — Assessment & Plan Note (Signed)
Check HSG--some couples get pregnant following this test. Will likely need REI referral given age, > 1 year of trying and normal female eval and likely normal ovulation.

## 2020-02-20 NOTE — Progress Notes (Signed)
   Subjective:    Patient ID: Angela Pollard is a 34 y.o. female presenting with Gynecologic Exam  on 02/20/2020  HPI: Here for evaluation of secondary infertility. Has h/o live birth in 2008. Has h/o chlamydia after that. Reports normal cycles q 27 days. Normal female fertility evaluation. Normal AMH. Has h/o HTN on pregnancy safe meds.  Review of Systems  Constitutional: Negative for chills and fever.  Respiratory: Negative for shortness of breath.   Cardiovascular: Negative for chest pain.  Gastrointestinal: Negative for abdominal pain, nausea and vomiting.  Genitourinary: Negative for dysuria.  Skin: Negative for rash.      Objective:    BP 134/87   Pulse 67   Ht 5\' 6"  (1.676 m)   Wt 167 lb (75.8 kg)   LMP 02/07/2020   BMI 26.95 kg/m  Physical Exam Constitutional:      General: She is not in acute distress.    Appearance: She is well-developed.  HENT:     Head: Normocephalic and atraumatic.  Eyes:     General: No scleral icterus. Cardiovascular:     Rate and Rhythm: Normal rate.  Pulmonary:     Effort: Pulmonary effort is normal.  Abdominal:     Palpations: Abdomen is soft.  Genitourinary:    Comments: BUS normal, vagina is pink and rugated, uterus is small and anteverted, no adnexal mass or tenderness.  Musculoskeletal:     Cervical back: Neck supple.  Skin:    General: Skin is warm and dry.  Neurological:     Mental Status: She is alert and oriented to person, place, and time.         Assessment & Plan:   Problem List Items Addressed This Visit      Unprioritized   Essential hypertension - Primary   Infertility, female    Check HSG--some couples get pregnant following this test. Will likely need REI referral given age, > 1 year of trying and normal female eval and likely normal ovulation.      Relevant Orders   DG Hysterogram (HSG)      Total time in review of prior notes, pathology, labs, history taking, review with patient, exam, note writing,  discussion of options, plan for next steps, alternatives and risks of treatment: 35 minutes.  Return in 6 weeks (on 04/02/2020).  06/02/2020 02/20/2020 3:32 PM

## 2020-03-19 ENCOUNTER — Ambulatory Visit: Payer: No Typology Code available for payment source | Admitting: Family Medicine

## 2020-04-03 ENCOUNTER — Other Ambulatory Visit: Payer: Self-pay

## 2020-04-03 ENCOUNTER — Other Ambulatory Visit (HOSPITAL_COMMUNITY)
Admission: RE | Admit: 2020-04-03 | Discharge: 2020-04-03 | Disposition: A | Discharge status: Home / Self Care | Payer: No Typology Code available for payment source | Source: Ambulatory Visit | Attending: Family Medicine | Admitting: Family Medicine

## 2020-04-03 ENCOUNTER — Ambulatory Visit (INDEPENDENT_AMBULATORY_CARE_PROVIDER_SITE_OTHER): Payer: No Typology Code available for payment source | Admitting: Family Medicine

## 2020-04-03 ENCOUNTER — Encounter: Payer: Self-pay | Admitting: Family Medicine

## 2020-04-03 ENCOUNTER — Other Ambulatory Visit: Payer: Self-pay | Admitting: Family Medicine

## 2020-04-03 VITALS — BP 123/85 | HR 61 | Wt 162.0 lb

## 2020-04-03 DIAGNOSIS — Z3480 Encounter for supervision of other normal pregnancy, unspecified trimester: Secondary | ICD-10-CM | POA: Diagnosis not present

## 2020-04-03 DIAGNOSIS — O169 Unspecified maternal hypertension, unspecified trimester: Secondary | ICD-10-CM | POA: Insufficient documentation

## 2020-04-03 DIAGNOSIS — Z3A08 8 weeks gestation of pregnancy: Secondary | ICD-10-CM | POA: Insufficient documentation

## 2020-04-03 MED ORDER — NIFEDIPINE ER OSMOTIC RELEASE 60 MG PO TB24: 60.0000 mg | ORAL_TABLET | Freq: Every day | ORAL | 1 refills | Status: DC

## 2020-04-03 NOTE — Progress Notes (Signed)
Last pap 01/02/2019 WNL  DATING AND VIABILITY SONOGRAM   Angela Pollard is a 34 y.o. year old G2P1001 with LMP Patient's last menstrual period was 02/07/2020 (exact date). which would correlate to  [redacted]w[redacted]d weeks gestation.  She has regular menstrual cycles.   She is here today for a confirmatory initial sonogram.    GESTATION: [place]     FETAL ACTIVITY:          Heart rate    163          The fetus is active.   GESTATIONAL AGE AND  BIOMETRICS:  Gestational criteria: Estimated Date of Delivery: 11/13/20 by LMP now at [redacted]w[redacted]d  Previous Scans:0  GESTATIONAL SAC           2.45 cm       7-4 weeks  CROWN RUMP LENGTH           1.25 cm       7-3  weeks                                                                               AVERAGE EGA(BY THIS SCAN):  7-4 weeks  WORKING EDD( LMP ):  11-13-2020     TECHNICIAN COMMENTS: Patient informed that the ultrasound is considered a limited obstetric ultrasound and is not intended to be a complete ultrasound exam. Patient also informed that the ultrasound is not being completed with the intent of assessing for fetal or placental anomalies or any pelvic abnormalities. Explained that the purpose of today's ultrasound is to assess for fetal heart rate. Patient acknowledges the purpose of the exam and the limitations of the study.     Armandina Stammer 04/03/2020 11:05 AM

## 2020-04-03 NOTE — Progress Notes (Signed)
Subjective:  Angela Pollard is a G2P1001 28w0dbeing seen today for her first obstetrical visit.  Her obstetrical history is significant for uncomplicated first pregnancy. Planned pregnancy. FOB is husband. Patient does intend to breast feed. Pregnancy history fully reviewed.  Patient reports nausea.  BP 123/85   Pulse 61   Wt 162 lb (73.5 kg)   LMP 02/07/2020 (Exact Date)   BMI 26.15 kg/m   HISTORY: OB History  Gravida Para Term Preterm AB Living  _0 SAB TAB Ectopic Multiple Live Births          1    # Outcome Date GA Lbr Len/2nd Weight Sex Delivery Anes PTL Lv  2 Current           1 Term 2008 490w0d M Vag-Spont EPI N LIV    Past Medical History:  Diagnosis Date  . Hypertension     Past Surgical History:  Procedure Laterality Date  . NO PAST SURGERIES      Family History  Problem Relation Age of Onset  . Brain cancer Mother 3390. Cancer Mother 3444     brain  . Kidney failure Father   . CVA Father   . Hypertension Father   . Diabetes Mellitus II Father   . Hyperlipidemia Father   . Diabetes Mellitus II Half-Sister   . Heart failure Maternal Aunt   . Diabetes Neg Hx      Exam  BP 123/85   Pulse 61   Wt 162 lb (73.5 kg)   LMP 02/07/2020 (Exact Date)   BMI 26.15 kg/m   Chaperone present during exam  CONSTITUTIONAL: Well-developed, well-nourished female in no acute distress.  HENT:  Normocephalic, atraumatic, External right and left ear normal. Oropharynx is clear and moist EYES: Conjunctivae and EOM are normal. Pupils are equal, round, and reactive to light. No scleral icterus.  NECK: Normal range of motion, supple, no masses.  Normal thyroid.  CARDIOVASCULAR: Normal heart rate noted, regular rhythm RESPIRATORY: Clear to auscultation bilaterally. Effort and breath sounds normal, no problems with respiration noted. BREASTS: Symmetric in size. No masses, skin changes, nipple drainage, or lymphadenopathy. ABDOMEN: Soft, normal bowel sounds, no  distention noted.  No tenderness, rebound or guarding.  PELVIC: Normal appearing external genitalia; normal appearing vaginal mucosa and cervix. No abnormal discharge noted. Normal uterine size, no other palpable masses, no uterine or adnexal tenderness. MUSCULOSKELETAL: Normal range of motion. No tenderness.  No cyanosis, clubbing, or edema.  2+ distal pulses. SKIN: Skin is warm and dry. No rash noted. Not diaphoretic. No erythema. No pallor. NEUROLOGIC: Alert and oriented to person, place, and time. Normal reflexes, muscle tone coordination. No cranial nerve deficit noted. PSYCHIATRIC: Normal mood and affect. Normal behavior. Normal judgment and thought content.    Assessment:    Pregnancy: G2P1001 Patient Active Problem List   Diagnosis Date Noted  . Supervision of other normal pregnancy, antepartum 04/03/2020  . Hypertension affecting pregnancy, antepartum 04/03/2020  . Infertility, female 02/20/2020  . Essential hypertension 01/02/2019      Plan:   1. [redacted] weeks gestation of pregnancy - GC[facility]lam[facility] amp [facility] - CBC/D/Plt+RPR+Rh+ABO+Rub Ab... - Comp Met (CMET) - Protein / creatinine ratio, urine  2. Supervision of other normal pregnancy, antepartum FHT and FH normal Discussed practice Desires Genetic testing - will get next app - [facility] -  CBC/D/Plt+RPR+Rh+ABO+Rub Ab... - Comp Met (CMET) - Protein / creatinine ratio, urine - Enroll Patient in Babyscripts  3. Hypertension affecting pregnancy, antepartum Stop labetalol, continue nifedipine ASA 32m At 12 weeks  - Comp Met (CMET) - Protein / creatinine ratio, urine - Enroll Patient in Babyscripts     Problem list reviewed and updated. 75% of 30 min visit spent on counseling and coordination of care.     JTruett Mainland10/12/2019

## 2020-04-04 LAB — CBC/D/PLT+RPR+RH+ABO+RUB AB...
Antibody Screen: NEGATIVE
Basophils Absolute: 0.1 10*3/uL (ref 0.0–0.2)
Basos: 1 %
EOS (ABSOLUTE): 0.1 10*3/uL (ref 0.0–0.4)
Eos: 1 %
HCV Ab: <0.1 s/co ratio (ref 0.0–0.9)
HIV Screen 4th Generation wRfx: NONREACTIVE
Hematocrit: 41.4 % (ref 34.0–46.6)
Hemoglobin: 13.8 g/dL (ref 11.1–15.9)
Hepatitis B Surface Ag: NEGATIVE
Immature Grans (Abs): 0 10*3/uL (ref 0.0–0.1)
Immature Granulocytes: 0 %
Lymphocytes Absolute: 1.9 10*3/uL (ref 0.7–3.1)
Lymphs: 22 %
MCH: 29 pg (ref 26.6–33.0)
MCHC: 33.3 g/dL (ref 31.5–35.7)
MCV: 87 fL (ref 79–97)
Monocytes Absolute: 0.5 10*3/uL (ref 0.1–0.9)
Monocytes: 5 %
Neutrophils Absolute: 6.2 10*3/uL (ref 1.4–7.0)
Neutrophils: 71 %
Platelets: 377 10*3/uL (ref 150–450)
RBC: 4.76 x10E6/uL (ref 3.77–5.28)
RDW: 13.2 % (ref 11.7–15.4)
RPR Ser Ql: NONREACTIVE
Rh Factor: POSITIVE
Rubella Antibodies, IGG: 2.09 index (ref >0.99)
WBC: 8.7 10*3/uL (ref 3.4–10.8)

## 2020-04-04 LAB — COMPREHENSIVE METABOLIC PANEL
ALT: 14 IU/L (ref 0–32)
AST: 14 IU/L (ref 0–40)
Albumin/Globulin Ratio: 1.4 (ref 1.2–2.2)
Albumin: 4.3 g/dL (ref 3.8–4.8)
Alkaline Phosphatase: 71 IU/L (ref 44–121)
BUN/Creatinine Ratio: 14 (ref 9–23)
BUN: 9 mg/dL (ref 6–20)
Bilirubin Total: 0.3 mg/dL (ref 0.0–1.2)
CO2: 24 mmol/L (ref 20–29)
Calcium: 9.8 mg/dL (ref 8.7–10.2)
Chloride: 98 mmol/L (ref 96–106)
Creatinine, Ser: 0.63 mg/dL (ref 0.57–1.00)
GFR calc Af Amer: 135 mL/min/{1.73_m2} (ref >59)
GFR calc non Af Amer: 117 mL/min/{1.73_m2} (ref >59)
Globulin, Total: 3 g/dL (ref 1.5–4.5)
Glucose: 89 mg/dL (ref 65–99)
Potassium: 4.4 mmol/L (ref 3.5–5.2)
Sodium: 136 mmol/L (ref 134–144)
Total Protein: 7.3 g/dL (ref 6.0–8.5)

## 2020-04-04 LAB — GC/CHLAMYDIA PROBE AMP (~~LOC~~) NOT AT ARMC
Chlamydia: NEGATIVE
Comment: NEGATIVE
Comment: NORMAL
Neisseria Gonorrhea: NEGATIVE

## 2020-04-04 LAB — HCV INTERPRETATION

## 2020-04-04 LAB — PROTEIN / CREATININE RATIO, URINE
Creatinine, Urine: 81.1 mg/dL
Protein, Ur: 11.4 mg/dL
Protein/Creat Ratio: 141 mg/g creat (ref 0–200)

## 2020-04-10 LAB — URINE CULTURE

## 2020-04-24 ENCOUNTER — Ambulatory Visit: Payer: No Typology Code available for payment source | Admitting: Family Medicine

## 2020-05-02 ENCOUNTER — Other Ambulatory Visit: Payer: Self-pay

## 2020-05-02 ENCOUNTER — Ambulatory Visit (INDEPENDENT_AMBULATORY_CARE_PROVIDER_SITE_OTHER): Payer: No Typology Code available for payment source | Admitting: Family Medicine

## 2020-05-02 VITALS — BP 122/72 | HR 72 | Wt 162.0 lb

## 2020-05-02 DIAGNOSIS — O169 Unspecified maternal hypertension, unspecified trimester: Secondary | ICD-10-CM

## 2020-05-02 DIAGNOSIS — Z3A12 12 weeks gestation of pregnancy: Secondary | ICD-10-CM

## 2020-05-02 DIAGNOSIS — Z3480 Encounter for supervision of other normal pregnancy, unspecified trimester: Secondary | ICD-10-CM

## 2020-05-02 NOTE — Progress Notes (Signed)
   PRENATAL VISIT NOTE  Subjective:  Angela Pollard is a 34 y.o. G2P1001 at [redacted]w[redacted]d being seen today for ongoing prenatal care.  She is currently monitored for the following issues for this high-risk pregnancy and has Essential hypertension; Infertility, female; Supervision of other normal pregnancy, antepartum; and Hypertension affecting pregnancy, antepartum on their problem list.  Patient reports nausea.  Contractions: Not present. Vag. Bleeding: None.  Movement: Absent. Denies leaking of fluid.   The following portions of the patient's history were reviewed and updated as appropriate: allergies, current medications, past family history, past medical history, past social history, past surgical history and problem list.   Objective:   Vitals:   05/02/20 0846  BP: 122/72  Pulse: 72  Weight: 162 lb (73.5 kg)    Fetal Status: Fetal Heart Rate (bpm): 167   Movement: Absent     General:  Alert, oriented and cooperative. Patient is in no acute distress.  Skin: Skin is warm and dry. No rash noted.   Cardiovascular: Normal heart rate noted  Respiratory: Normal respiratory effort, no problems with respiration noted  Abdomen: Soft, gravid, appropriate for gestational age.  Pain/Pressure: Absent     Pelvic: Cervical exam deferred        Extremities: Normal range of motion.  Edema: None  Mental Status: Normal mood and affect. Normal behavior. Normal judgment and thought content.   Assessment and Plan:  Pregnancy: G2P1001 at [redacted]w[redacted]d 1. Supervision of other normal pregnancy, antepartum FHT normal - [place] MFM OB DETAIL +14 WK; Future - Genetic Screening  2. [redacted] weeks gestation of pregnancy - [place] MFM OB DETAIL +14 WK; Future - Genetic Screening  3. Hypertension affecting pregnancy, antepartum BP normal Start AsA 81mg .   Preterm labor symptoms and general obstetric precautions including but not limited to vaginal bleeding, contractions, leaking of fluid and fetal movement were reviewed in detail  with the patient. Please refer to After Visit Summary for other counseling recommendations.   Return in about 4 weeks (around 05/30/2020) for OB f/u, In Office.  No future appointments.  14/08/2019, DO

## 2020-05-08 ENCOUNTER — Other Ambulatory Visit: Payer: Self-pay

## 2020-05-08 ENCOUNTER — Ambulatory Visit (INDEPENDENT_AMBULATORY_CARE_PROVIDER_SITE_OTHER): Payer: No Typology Code available for payment source | Admitting: Family Medicine

## 2020-05-08 VITALS — BP 112/70 | HR 62 | Wt 164.0 lb

## 2020-05-08 DIAGNOSIS — Z3A13 13 weeks gestation of pregnancy: Secondary | ICD-10-CM

## 2020-05-08 DIAGNOSIS — Z3480 Encounter for supervision of other normal pregnancy, unspecified trimester: Secondary | ICD-10-CM

## 2020-05-08 DIAGNOSIS — Z23 Encounter for immunization: Secondary | ICD-10-CM | POA: Diagnosis not present

## 2020-05-08 DIAGNOSIS — O4690 Antepartum hemorrhage, unspecified, unspecified trimester: Secondary | ICD-10-CM

## 2020-05-08 DIAGNOSIS — I10 Essential (primary) hypertension: Secondary | ICD-10-CM

## 2020-05-08 NOTE — Progress Notes (Signed)
   PRENATAL VISIT NOTE  Subjective:  Angela Pollard is a 34 y.o. G2P1001 at [redacted]w[redacted]d being seen today for ongoing prenatal care.  She is currently monitored for the following issues for this high-risk pregnancy and has Essential hypertension; Infertility, female; Supervision of other normal pregnancy, antepartum; and Hypertension affecting pregnancy, antepartum on their problem list.  Patient reports vaginal bleeding this morning. Had orgasm from self-stimulation - no penetration. Had some spotting when she wiped afterwards. Later on this morning, passed some blood clots.  Contractions: Not present. Vag. Bleeding: Small.  Movement: Absent. Denies leaking of fluid.   The following portions of the patient's history were reviewed and updated as appropriate: allergies, current medications, past family history, past medical history, past social history, past surgical history and problem list.   Objective:   Vitals:   05/08/20 1454  BP: 112/70  Pulse: 62  Weight: 164 lb (74.4 kg)    Fetal Status: Fetal Heart Rate (bpm): 160   Movement: Absent     General:  Alert, oriented and cooperative. Patient is in no acute distress.  Skin: Skin is warm and dry. No rash noted.   Cardiovascular: Normal heart rate noted  Respiratory: Normal respiratory effort, no problems with respiration noted  Abdomen: Soft, gravid, appropriate for gestational age.  Pain/Pressure: Present     Pelvic: Cervical exam performed in the presence of a chaperone       Some brownish blood in vagina on speculum exam. No bright red bleeding.  Extremities: Normal range of motion.  Edema: None  Mental Status: Normal mood and affect. Normal behavior. Normal judgment and thought content.   Assessment and Plan:  Pregnancy: G2P1001 at [redacted]w[redacted]d 1. [redacted] weeks gestation of pregnancy   2. Supervision of other normal pregnancy, antepartum Bedside US done with good fetal heart tones and fetal movement.   3. Essential hypertension BP  normal  4. Vaginal bleeding during pregnancy, antepartum Pelvic rest until next appointment.   Preterm labor symptoms and general obstetric precautions including but not limited to vaginal bleeding, contractions, leaking of fluid and fetal movement were reviewed in detail with the patient. Please refer to After Visit Summary for other counseling recommendations.   No follow-ups on file.  Future Appointments  Date Time Provider Department Center  05/30/2020  8:30 AM Levie Heritage, DO [facility] None  06/26/2020  8:00 AM [facility] NURSE [facility]  06/26/2020  8:15 AM [facility] US2 [facility]    Levie Heritage, DO

## 2020-05-30 ENCOUNTER — Ambulatory Visit (INDEPENDENT_AMBULATORY_CARE_PROVIDER_SITE_OTHER): Payer: No Typology Code available for payment source | Admitting: Family Medicine

## 2020-05-30 ENCOUNTER — Other Ambulatory Visit: Payer: Self-pay

## 2020-05-30 VITALS — BP 126/75 | HR 70 | Wt 158.0 lb

## 2020-05-30 DIAGNOSIS — Z3480 Encounter for supervision of other normal pregnancy, unspecified trimester: Secondary | ICD-10-CM

## 2020-05-30 DIAGNOSIS — O169 Unspecified maternal hypertension, unspecified trimester: Secondary | ICD-10-CM

## 2020-05-30 NOTE — Progress Notes (Signed)
   PRENATAL VISIT NOTE  Subjective:  Angela Pollard is a 34 y.o. G2P1001 at [redacted]w[redacted]d being seen today for ongoing prenatal care.  She is currently monitored for the following issues for this high-risk pregnancy and has Essential hypertension; Infertility, female; Supervision of other normal pregnancy, antepartum; and Hypertension affecting pregnancy, antepartum on their problem list.  Patient reports no complaints. Bleeding resolved. Contractions: Not present. Vag. Bleeding: None.  Movement: Present. Denies leaking of fluid.   The following portions of the patient's history were reviewed and updated as appropriate: allergies, current medications, past family history, past medical history, past social history, past surgical history and problem list.   Objective:   Vitals:   05/30/20 0828  BP: 126/75  Pulse: 70  Weight: 158 lb (71.7 kg)    Fetal Status: Fetal Heart Rate (bpm): 150 Fundal Height: 15 cm Movement: Present     General:  Alert, oriented and cooperative. Patient is in no acute distress.  Skin: Skin is warm and dry. No rash noted.   Cardiovascular: Normal heart rate noted  Respiratory: Normal respiratory effort, no problems with respiration noted  Abdomen: Soft, gravid, appropriate for gestational age.  Pain/Pressure: Present     Pelvic: Cervical exam deferred        Extremities: Normal range of motion.  Edema: None  Mental Status: Normal mood and affect. Normal behavior. Normal judgment and thought content.   Assessment and Plan:  Pregnancy: G2P1001 at [redacted]w[redacted]d 1. Supervision of other normal pregnancy, antepartum FHT and FH normal. Weight loss - appetite normal. Advised to use some protein shakes between meals to boost calories.  2. Hypertension affecting pregnancy, antepartum On procardia. BP normal. On ASA 81mg .   Preterm labor symptoms and general obstetric precautions including but not limited to vaginal bleeding, contractions, leaking of fluid and fetal movement were  reviewed in detail with the patient. Please refer to After Visit Summary for other counseling recommendations.   Return in about 4 weeks (around 06/27/2020).  Future Appointments  Date Time Provider Department Center  06/26/2020  8:00 AM [employer] NURSE [facility]  06/26/2020  8:15 AM [facility] US2 [facility]  06/26/2020  1:45 PM 06/28/2020, MD [facility] None    Willodean Rosenthal, DO

## 2020-05-30 NOTE — Progress Notes (Signed)
Patient complaining of constipation. Patient also concerned about weight loss since last visit. Armandina Stammer RN

## 2020-06-06 ENCOUNTER — Inpatient Hospital Stay (HOSPITAL_BASED_OUTPATIENT_CLINIC_OR_DEPARTMENT_OTHER): Payer: No Typology Code available for payment source

## 2020-06-06 ENCOUNTER — Telehealth: Payer: Self-pay

## 2020-06-06 ENCOUNTER — Inpatient Hospital Stay (HOSPITAL_COMMUNITY)
Admission: AD | Admit: 2020-06-06 | Discharge: 2020-06-06 | Disposition: A | Discharge status: Home / Self Care | Payer: No Typology Code available for payment source | Attending: Obstetrics and Gynecology | Admitting: Obstetrics and Gynecology

## 2020-06-06 ENCOUNTER — Encounter (HOSPITAL_COMMUNITY): Payer: Self-pay | Admitting: Obstetrics and Gynecology

## 2020-06-06 ENCOUNTER — Other Ambulatory Visit: Payer: Self-pay

## 2020-06-06 DIAGNOSIS — O4692 Antepartum hemorrhage, unspecified, second trimester: Secondary | ICD-10-CM

## 2020-06-06 DIAGNOSIS — Z87891 Personal history of nicotine dependence: Secondary | ICD-10-CM | POA: Diagnosis not present

## 2020-06-06 DIAGNOSIS — N841 Polyp of cervix uteri: Secondary | ICD-10-CM | POA: Diagnosis not present

## 2020-06-06 DIAGNOSIS — Z79899 Other long term (current) drug therapy: Secondary | ICD-10-CM | POA: Diagnosis not present

## 2020-06-06 DIAGNOSIS — Z3A17 17 weeks gestation of pregnancy: Secondary | ICD-10-CM | POA: Insufficient documentation

## 2020-06-06 DIAGNOSIS — O3442 Maternal care for other abnormalities of cervix, second trimester: Secondary | ICD-10-CM | POA: Insufficient documentation

## 2020-06-06 DIAGNOSIS — O209 Hemorrhage in early pregnancy, unspecified: Secondary | ICD-10-CM | POA: Insufficient documentation

## 2020-06-06 DIAGNOSIS — O99891 Other specified diseases and conditions complicating pregnancy: Secondary | ICD-10-CM

## 2020-06-06 DIAGNOSIS — O10912 Unspecified pre-existing hypertension complicating pregnancy, second trimester: Secondary | ICD-10-CM | POA: Insufficient documentation

## 2020-06-06 DIAGNOSIS — Z7982 Long term (current) use of aspirin: Secondary | ICD-10-CM | POA: Diagnosis not present

## 2020-06-06 LAB — CBC
HCT: 34.2 % — ABNORMAL LOW (ref 36.0–46.0)
Hemoglobin: 11.9 g/dL — ABNORMAL LOW (ref 12.0–15.0)
MCH: 29.2 pg (ref 26.0–34.0)
MCHC: 34.8 g/dL (ref 30.0–36.0)
MCV: 83.8 fL (ref 80.0–100.0)
Platelets: 306 10*3/uL (ref 150–400)
RBC: 4.08 MIL/uL (ref 3.87–5.11)
RDW: 12.7 % (ref 11.5–15.5)
WBC: 8.5 10*3/uL (ref 4.0–10.5)
nRBC: 0 % (ref 0.0–0.2)

## 2020-06-06 LAB — WET PREP, GENITAL
Clue Cells Wet Prep HPF POC: NONE SEEN
Sperm: NONE SEEN
Trich, Wet Prep: NONE SEEN
Yeast Wet Prep HPF POC: NONE SEEN

## 2020-06-06 NOTE — MAU Provider Note (Signed)
Chief Complaint:  Vaginal Bleeding   Event Date/Time   First Provider Initiated Contact with Patient 06/06/20 0919     HPI: Angela Pollard is a 34 y.o. G2P1001 at [redacted]w[redacted]d who presents to maternity admissions reporting moderate vaginal bleeding post-IC. She had similar (but not as heavy) vaginal bleeding earlier in pregnancy after non-penetrative sex and was told to wait a few weeks before attempting again. Post-coital bleeding this morning was dark red, soaked a panty liner and could be seen in the toilet and after wiping. A long irregularly shaped clot was noted after wiping the first time. Endorses very mild cramping right after IC, rates it "maybe a 1/10". States she had a bedside U/S in the office and was told "everything looks fine."  This is her 2nd pregnancy, she had no issues with bleeding in her first pregnancy (68yrs ago). Denies fever, falls, or recent illness.   Pregnancy Course: chronic hypertension (on ASA), normal NIPT  Past Medical History:  Diagnosis Date  . Hypertension    OB History  Gravida Para Term Preterm AB Living  2 1 1     1   SAB IAB Ectopic Multiple Live Births          1    # Outcome Date GA Lbr Len/2nd Weight Sex Delivery Anes PTL Lv  2 Current           1 Term 2008 [redacted]w[redacted]d   M Vag-Spont EPI N LIV   Past Surgical History:  Procedure Laterality Date  . NO PAST SURGERIES     Family History  Problem Relation Age of Onset  . Brain cancer Mother 73  . Cancer Mother 64       brain  . Kidney failure Father   . CVA Father   . Hypertension Father   . Diabetes Mellitus II Father   . Hyperlipidemia Father   . Diabetes Mellitus II Half-Sister   . Heart failure Maternal Aunt   . Diabetes Neg Hx    Social History   Tobacco Use  . Smoking status: Former Smoker    Packs/day: 0.30    Years: 8.00    Pack years: 2.40  . Smokeless tobacco: Never Used  Vaping Use  . Vaping Use: Never used  Substance Use Topics  . Alcohol use: Not Currently    Comment:  occasional  . Drug use: Never   No Known Allergies Medications Prior to Admission  Medication Sig Dispense Refill Last Dose  . aspirin EC 81 MG tablet Take 81 mg by mouth daily. Swallow whole.     . folic acid (FOLVITE) 0.5 MG tablet Take 0.5 mg by mouth daily.     20 NIFEdipine (PROCARDIA XL/NIFEDICAL XL) 60 MG 24 hr tablet Take 1 tablet (60 mg total) by mouth daily. 90 tablet 1   . Prenatal Vit-Fe Fumarate-FA (PRENATAL VITAMINS PO) Take by mouth.       I have reviewed patient's Past Medical Hx, Surgical Hx, Family Hx, Social Hx, medications and allergies.   ROS:  Review of Systems  Constitutional: Negative for fever.  Gastrointestinal: Positive for abdominal pain (lower abd, diffuse very mild). Negative for constipation (none today), nausea and vomiting.  Genitourinary: Positive for vaginal bleeding. Negative for vaginal discharge.  Neurological: Negative for dizziness, syncope and headaches.  All other systems reviewed and are negative.   Physical Exam   Patient Vitals for the past 24 hrs:  BP Temp Temp src Pulse Resp SpO2 Height  06/06/20 1153 114/72 -- -- 14/10/21  59 15 100 % --  06/06/20 0916 122/74 -- -- 64 -- -- --  06/06/20 0909 129/81 97.7 F (36.5 C) Oral 63 15 100 % 5\' 6"  (1.676 m)   Constitutional: Well-developed, well-nourished female in no acute distress.  Cardiovascular: normal rate & rhythm, no murmur Respiratory: normal effort, lung sounds clear throughout GI: Abd soft, non-tender, gravid appropriate for gestational age. Pos BS x 4 MS: Extremities nontender, no edema, normal ROM Neurologic: Alert and oriented x 4.  GU: no CVA tenderness Pelvic exam deferred until after U/S: Copious dark red-brown blood noted on exam, transformation zone easily visible, cervix friable with 1cm strawberry red polyp on the posterior surface of the cervix. Last PAP in July 2020, was normal.  FHR: 150  Labs: Results for orders placed or performed during the hospital encounter of  06/06/20 (from the past 24 hour(s))  CBC     Status: Abnormal   Collection Time: 06/06/20 10:02 AM  Result Value Ref Range   WBC 8.5 4.0 - 10.5 K/uL   RBC 4.08 3.87 - 5.11 MIL/uL   Hemoglobin 11.9 (L) 12.0 - 15.0 g/dL   HCT 14/10/21 (L) 24.2 - 35.3 %   MCV 83.8 80.0 - 100.0 fL   MCH 29.2 26.0 - 34.0 pg   MCHC 34.8 30.0 - 36.0 g/dL   RDW 61.4 43.1 - 54.0 %   Platelets 306 150 - 400 K/uL   nRBC 0.0 0.0 - 0.2 %  Wet prep, genital     Status: Abnormal   Collection Time: 06/06/20 11:44 AM   Specimen: Vaginal  Result Value Ref Range   Yeast Wet Prep HPF POC NONE SEEN NONE SEEN   Trich, Wet Prep NONE SEEN NONE SEEN   Clue Cells Wet Prep HPF POC NONE SEEN NONE SEEN   WBC, Wet Prep HPF POC MODERATE (A) NONE SEEN   Sperm NONE SEEN     Imaging:   Media Information         Document Information  Ultrasound    06/06/2020 00:00  Attached To:  Hospital Encounter on 06/06/20   Source Information  Default, Provider, MD    MAU Course: Orders Placed This Encounter  Procedures  . Wet prep, genital  . 14/10/21 MFM OB LIMITED  . Urinalysis, Routine w reflex microscopic Urine, Clean Catch  . Von Willebrand panel  . CBC  . Discharge patient   No orders of the defined types were placed in this encounter.  MDM: Von Willebrand and GC/CT pending Wet prep, CBC and U/S normal, no abruption or previa noted Obvious polyp on speculum exam - educated about normalcy of sex causing bleeding with polyps. Can be on pelvic rest if desired, also discussed ways to engage in sexual activity while minimizing risk for bleeding.   Assessment: 1. Vaginal bleeding in pregnancy, second trimester   2. Cervical polyp    Plan: Discharge home in stable condition with bleeding precautions.     Follow-up Information    [facility]. Go to.   Specialty: Obstetrics and Gynecology Why: as scheduled for ongoing prenatal care Contact information: [address] [place] [phone]              Allergies as of 06/06/2020   No Known Allergies     Medication List    TAKE these medications   aspirin EC 81 MG tablet Take 81 mg by mouth daily. Swallow whole.  folic acid 0.5 MG tablet Commonly known as: FOLVITE Take 0.5 mg by mouth daily.   NIFEdipine 60 MG 24 hr tablet Commonly known as: PROCARDIA XL/NIFEDICAL XL Take 1 tablet (60 mg total) by mouth daily.   PRENATAL VITAMINS PO Take by mouth.       Edd Arbour, CNM, MSN, [facility] 06/06/20 12:19 PM

## 2020-06-06 NOTE — MAU Note (Signed)
.   Angela Pollard is a 34 y.o. at [redacted]w[redacted]d here in MAU reporting: vaginal bleeding that started this morning after sex. She states that this had happened before and they told her no sex but told her it was okay an this is the first time she has had intercourse since then. No LOF.   Pain score: 0 Vitals:   06/06/20 0909  BP: 129/81  Pulse: 63  Resp: 15  Temp: 97.7 F (36.5 C)  SpO2: 100%     FHT:153 Lab orders placed from triage: UA

## 2020-06-06 NOTE — Telephone Encounter (Signed)
Pt called stating she had some bleeding after intercourse. Pt states it has happened before but she noticed a clot after she wiped and she is on her way to [facility]. Pt made aware that you can have some light spotting after intercourse because the cervix is friable during pregnancy. Understanding was voiced.  Shearon Clonch l Jelisha Weed, CMA

## 2020-06-06 NOTE — Discharge Instructions (Signed)
Vaginal Bleeding During Pregnancy, Second Trimester  A small amount of bleeding (spotting) from the vagina is common during pregnancy. Sometimes the bleeding is normal and is not a sign of problems. In some other cases, it is a sign of something serious. Tell your doctor right away if there is any bleeding from your vagina. Follow these instructions at home: Activity  Follow your doctor's instructions about how active you can be.  If needed, make plans for someone to help with your normal activities.  Do not exercise or do activities that take a lot of effort until your doctor says that this is safe.  Do not lift anything that is heavier than 10 lb (4.5 kg) until your doctor says that this is safe.  Do not have sex or orgasms until your doctor says that this is safe. Medicines  Take over-the-counter and prescription medicines only as told by your doctor.  Do not take aspirin. It can cause bleeding. General instructions  Watch your condition for any changes.  Write down: ? The number of pads you use each day. ? How often you change pads. ? How soaked your pads are.  Do not use tampons.  Do not douche.  If you pass any tissue from your vagina, save it to show to your doctor.  Keep all follow-up visits as told by your doctor. This is important. Contact a doctor if:  You have bleeding in the vagina at any time during pregnancy.  You have cramps.  You have a fever that does not get better with medicine. Get help right away if:  You have very bad cramps in your back or belly (abdomen).  You have contractions.  You have chills.  You pass large clots or a lot of tissue from your vagina.  Your bleeding gets worse.  You feel light-headed.  You feel weak.  You pass out (faint).  You are leaking fluid from your vagina.  You have a gush of fluid from your vagina. Summary  Sometimes vaginal bleeding during pregnancy is normal and is not a problem.Yours is a  completely normal reason, but please discuss with Dr. Adrian Blackwater at your next visit.  Tell your doctor about any bleeding from your vagina right away.  Follow your doctor's instructions about how active you can be. You may need someone to help you with your normal activities. This information is not intended to replace advice given to you by your health care provider. Make sure you discuss any questions you have with your health care provider. Document Revised: 10/03/2018 Document Reviewed: 09/15/2016 Elsevier Patient Education  2020 [employer].

## 2020-06-07 LAB — VON WILLEBRAND PANEL
Coagulation Factor VIII: 181 % — ABNORMAL HIGH (ref 56–140)
Ristocetin Co-factor, Plasma: 136 % (ref 50–200)
Von Willebrand Antigen, Plasma: 227 % — ABNORMAL HIGH (ref 50–200)

## 2020-06-07 LAB — COAG STUDIES INTERP REPORT

## 2020-06-26 ENCOUNTER — Ambulatory Visit: Payer: No Typology Code available for payment source | Admitting: *Deleted

## 2020-06-26 ENCOUNTER — Encounter: Payer: Self-pay | Admitting: Obstetrics & Gynecology

## 2020-06-26 ENCOUNTER — Encounter: Payer: Self-pay | Admitting: *Deleted

## 2020-06-26 ENCOUNTER — Ambulatory Visit: Payer: No Typology Code available for payment source | Attending: Family Medicine

## 2020-06-26 ENCOUNTER — Ambulatory Visit (INDEPENDENT_AMBULATORY_CARE_PROVIDER_SITE_OTHER): Payer: No Typology Code available for payment source | Admitting: Obstetrics & Gynecology

## 2020-06-26 ENCOUNTER — Other Ambulatory Visit (HOSPITAL_COMMUNITY)
Admission: RE | Admit: 2020-06-26 | Discharge: 2020-06-26 | Disposition: A | Discharge status: Home / Self Care | Payer: No Typology Code available for payment source | Source: Ambulatory Visit | Attending: Obstetrics & Gynecology | Admitting: Obstetrics & Gynecology

## 2020-06-26 ENCOUNTER — Other Ambulatory Visit: Payer: Self-pay | Admitting: *Deleted

## 2020-06-26 ENCOUNTER — Other Ambulatory Visit: Payer: Self-pay

## 2020-06-26 VITALS — BP 115/70 | HR 76

## 2020-06-26 VITALS — BP 110/71 | HR 73 | Wt 162.1 lb

## 2020-06-26 DIAGNOSIS — N841 Polyp of cervix uteri: Secondary | ICD-10-CM

## 2020-06-26 DIAGNOSIS — O10912 Unspecified pre-existing hypertension complicating pregnancy, second trimester: Secondary | ICD-10-CM

## 2020-06-26 DIAGNOSIS — O169 Unspecified maternal hypertension, unspecified trimester: Secondary | ICD-10-CM

## 2020-06-26 DIAGNOSIS — Z3A12 12 weeks gestation of pregnancy: Secondary | ICD-10-CM | POA: Insufficient documentation

## 2020-06-26 DIAGNOSIS — Z3480 Encounter for supervision of other normal pregnancy, unspecified trimester: Secondary | ICD-10-CM

## 2020-06-26 DIAGNOSIS — Z3A20 20 weeks gestation of pregnancy: Secondary | ICD-10-CM

## 2020-06-26 DIAGNOSIS — Z362 Encounter for other antenatal screening follow-up: Secondary | ICD-10-CM

## 2020-06-26 MED ORDER — NIFEDIPINE ER OSMOTIC RELEASE 60 MG PO TB24: 60.0000 mg | ORAL_TABLET | Freq: Every day | ORAL | 1 refills | Status: DC

## 2020-06-26 MED FILL — NIFEDIPINE ER OSMOTIC RELEA: 60 | 90 days supply | Qty: 90 | Fill #0

## 2020-06-26 NOTE — Progress Notes (Signed)
   PRENATAL VISIT NOTE  Subjective:  Angela Pollard is a 34 y.o. G2P1001 at [redacted]w[redacted]d being seen today for ongoing prenatal care.  She is currently monitored for the following issues for this high-risk pregnancy and has Essential hypertension; Infertility, female; Supervision of other normal pregnancy, antepartum; and Hypertension affecting pregnancy, antepartum on their problem list.  Patient reports bleeding ater IC. She was seen in the MAU. She has had no further bleeding since she was seen and she had no further intercourse. .  Contractions: Not present. Vag. Bleeding: None.  Movement: Present. Denies leaking of fluid.   The following portions of the patient's history were reviewed and updated as appropriate: allergies, current medications, past family history, past medical history, past social history, past surgical history and problem list.   Objective:   Vitals:   06/26/20 1338  BP: 110/71  Pulse: 73  Weight: 162 lb 1.3 oz (73.5 kg)    Fetal Status: Fetal Heart Rate (bpm): 145   Movement: Present     General:  Alert, oriented and cooperative. Patient is in no acute distress.  Skin: Skin is warm and dry. No rash noted.   Cardiovascular: Normal heart rate noted  Respiratory: Normal respiratory effort, no problems with respiration noted  Abdomen: Soft, gravid, appropriate for gestational age.  Pain/Pressure: Absent     Pelvic: Cervical exam performed in the presence of a chaperone      Using a Ringed forceps, the large cervical polyp was removed. Silver Nitrate was applied.    Extremities: Normal range of motion.  Edema: None  Mental Status: Normal mood and affect. Normal behavior. Normal judgment and thought content.   Assessment and Plan:  Pregnancy: G2P1001 at [redacted]w[redacted]d 1. [redacted] weeks gestation of pregnancy  2. Supervision of other normal pregnancy, antepartum Normal FHR and FH  3. Hypertension affecting pregnancy, antepartum Refilled Procardia Pt is taking baby ASA daily.    4. Cervical polyp Removed in the ofc today   Preterm labor symptoms and general obstetric precautions including but not limited to vaginal bleeding, contractions, leaking of fluid and fetal movement were reviewed in detail with the patient. Please refer to After Visit Summary for other counseling recommendations.   No follow-ups on file.  Future Appointments  Date Time Provider Department Center  07/24/2020  8:45 AM [facility] US4 [facility]    Willodean Rosenthal, MD

## 2020-06-29 LAB — AFP, SERUM, OPEN SPINA BIFIDA
AFP MoM: 3.11
AFP Value: 169.5 ng/mL
Gest. Age on Collection Date: 20 weeks
Maternal Age At EDD: 35.5 yr
OSBR Risk 1 IN: 98
Test Results:: POSITIVE — AB
Weight: 162 [lb_av]

## 2020-07-01 LAB — SURGICAL PATHOLOGY

## 2020-07-04 ENCOUNTER — Telehealth: Payer: Self-pay | Admitting: Family Medicine

## 2020-07-04 NOTE — Telephone Encounter (Signed)
Patient notified of abnormal AFP. Reviewed last [place] - normal views of baby's spine. Will send message to MFM to review. Has f/u US on 1/27.

## 2020-07-24 ENCOUNTER — Ambulatory Visit: Payer: No Typology Code available for payment source | Attending: Obstetrics and Gynecology

## 2020-07-24 ENCOUNTER — Other Ambulatory Visit: Payer: Self-pay | Admitting: *Deleted

## 2020-07-24 ENCOUNTER — Ambulatory Visit: Payer: No Typology Code available for payment source | Admitting: *Deleted

## 2020-07-24 ENCOUNTER — Ambulatory Visit (INDEPENDENT_AMBULATORY_CARE_PROVIDER_SITE_OTHER): Payer: BC Managed Care – PPO | Admitting: Family Medicine

## 2020-07-24 ENCOUNTER — Encounter: Payer: Self-pay | Admitting: *Deleted

## 2020-07-24 ENCOUNTER — Other Ambulatory Visit: Payer: Self-pay

## 2020-07-24 VITALS — BP 114/75 | HR 75 | Wt 166.0 lb

## 2020-07-24 VITALS — BP 118/73 | HR 68

## 2020-07-24 DIAGNOSIS — Z362 Encounter for other antenatal screening follow-up: Secondary | ICD-10-CM | POA: Diagnosis not present

## 2020-07-24 DIAGNOSIS — Z3480 Encounter for supervision of other normal pregnancy, unspecified trimester: Secondary | ICD-10-CM

## 2020-07-24 DIAGNOSIS — Z3A24 24 weeks gestation of pregnancy: Secondary | ICD-10-CM | POA: Diagnosis not present

## 2020-07-24 DIAGNOSIS — R772 Abnormality of alphafetoprotein: Secondary | ICD-10-CM

## 2020-07-24 DIAGNOSIS — O169 Unspecified maternal hypertension, unspecified trimester: Secondary | ICD-10-CM

## 2020-07-24 DIAGNOSIS — O321XX0 Maternal care for breech presentation, not applicable or unspecified: Secondary | ICD-10-CM | POA: Diagnosis not present

## 2020-07-24 DIAGNOSIS — O10912 Unspecified pre-existing hypertension complicating pregnancy, second trimester: Secondary | ICD-10-CM | POA: Diagnosis not present

## 2020-07-24 NOTE — Progress Notes (Signed)
   PRENATAL VISIT NOTE  Subjective:  Angela Pollard is a 35 y.o. G2P1001 at [redacted]w[redacted]d being seen today for ongoing prenatal care.  She is currently monitored for the following issues for this high-risk pregnancy and has Essential hypertension; Infertility, female; Supervision of other normal pregnancy, antepartum; and Hypertension affecting pregnancy, antepartum on their problem list.  Patient reports occasional rectus muscle pain.  Contractions: Not present. Vag. Bleeding: None.  Movement: Present. Denies leaking of fluid.   The following portions of the patient's history were reviewed and updated as appropriate: allergies, current medications, past family history, past medical history, past social history, past surgical history and problem list.   Objective:   Vitals:   07/24/20 1111  BP: 114/75  Pulse: 75  Weight: 166 lb (75.3 kg)    Fetal Status: Fetal Heart Rate (bpm): 145   Movement: Present     General:  Alert, oriented and cooperative. Patient is in no acute distress.  Skin: Skin is warm and dry. No rash noted.   Cardiovascular: Normal heart rate noted  Respiratory: Normal respiratory effort, no problems with respiration noted  Abdomen: Soft, gravid, appropriate for gestational age.  Pain/Pressure: Present     Pelvic: Cervical exam deferred        Extremities: Normal range of motion.  Edema: None  Mental Status: Normal mood and affect. Normal behavior. Normal judgment and thought content.   Assessment and Plan:  Pregnancy: G2P1001 at [redacted]w[redacted]d  1. Supervision of other normal pregnancy, antepartum FHT and FH normal  2. Hypertension affecting pregnancy, antepartum Controlled on medications ASA 81mg  Growth normal   Preterm labor symptoms and general obstetric precautions including but not limited to vaginal bleeding, contractions, leaking of fluid and fetal movement were reviewed in detail with the patient. Please refer to After Visit Summary for other counseling  recommendations.   Return in about 4 weeks (around 08/21/2020) for OB f/u, 2 hr GTT, In Office.  Future Appointments  Date Time Provider Department Center  08/21/2020 11:15 AM [facility]  08/21/2020 11:30 AM [facility] US3 [facility]    [facility] DO

## 2020-08-21 ENCOUNTER — Ambulatory Visit: Payer: No Typology Code available for payment source | Attending: Maternal & Fetal Medicine

## 2020-08-21 ENCOUNTER — Ambulatory Visit (INDEPENDENT_AMBULATORY_CARE_PROVIDER_SITE_OTHER): Payer: BC Managed Care – PPO | Admitting: Family Medicine

## 2020-08-21 ENCOUNTER — Other Ambulatory Visit: Payer: Self-pay | Admitting: *Deleted

## 2020-08-21 ENCOUNTER — Ambulatory Visit: Payer: No Typology Code available for payment source | Admitting: *Deleted

## 2020-08-21 ENCOUNTER — Encounter: Payer: Self-pay | Admitting: *Deleted

## 2020-08-21 ENCOUNTER — Other Ambulatory Visit: Payer: Self-pay

## 2020-08-21 VITALS — BP 124/82 | HR 79 | Wt 172.0 lb

## 2020-08-21 VITALS — BP 134/76 | HR 80

## 2020-08-21 DIAGNOSIS — O289 Unspecified abnormal findings on antenatal screening of mother: Secondary | ICD-10-CM

## 2020-08-21 DIAGNOSIS — Z3A28 28 weeks gestation of pregnancy: Secondary | ICD-10-CM

## 2020-08-21 DIAGNOSIS — O169 Unspecified maternal hypertension, unspecified trimester: Secondary | ICD-10-CM

## 2020-08-21 DIAGNOSIS — Z3480 Encounter for supervision of other normal pregnancy, unspecified trimester: Secondary | ICD-10-CM | POA: Diagnosis not present

## 2020-08-21 DIAGNOSIS — R772 Abnormality of alphafetoprotein: Secondary | ICD-10-CM | POA: Diagnosis not present

## 2020-08-21 DIAGNOSIS — Z23 Encounter for immunization: Secondary | ICD-10-CM

## 2020-08-21 DIAGNOSIS — O10013 Pre-existing essential hypertension complicating pregnancy, third trimester: Secondary | ICD-10-CM

## 2020-08-21 DIAGNOSIS — O10913 Unspecified pre-existing hypertension complicating pregnancy, third trimester: Secondary | ICD-10-CM | POA: Diagnosis not present

## 2020-08-21 DIAGNOSIS — O321XX0 Maternal care for breech presentation, not applicable or unspecified: Secondary | ICD-10-CM

## 2020-08-21 NOTE — Progress Notes (Signed)
   PRENATAL VISIT NOTE  Subjective:  Angela Pollard is a 35 y.o. G2P1001 at [redacted]w[redacted]d being seen today for ongoing prenatal care.  She is currently monitored for the following issues for this high-risk pregnancy and has Essential hypertension; Infertility, female; Supervision of other normal pregnancy, antepartum; and Hypertension affecting pregnancy, antepartum on their problem list.  Patient reports no complaints.  Contractions: Not present. Vag. Bleeding: None.  Movement: Present. Denies leaking of fluid.   The following portions of the patient's history were reviewed and updated as appropriate: allergies, current medications, past family history, past medical history, past social history, past surgical history and problem list.   Objective:   Vitals:   08/21/20 0811  BP: 124/82  Pulse: 79  Weight: 172 lb (78 kg)    Fetal Status: Fetal Heart Rate (bpm): 135 Fundal Height: 27 cm Movement: Present     General:  Alert, oriented and cooperative. Patient is in no acute distress.  Skin: Skin is warm and dry. No rash noted.   Cardiovascular: Normal heart rate noted  Respiratory: Normal respiratory effort, no problems with respiration noted  Abdomen: Soft, gravid, appropriate for gestational age.  Pain/Pressure: Present     Pelvic: Cervical exam deferred        Extremities: Normal range of motion.  Edema: None  Mental Status: Normal mood and affect. Normal behavior. Normal judgment and thought content.   Assessment and Plan:  Pregnancy: G2P1001 at [redacted]w[redacted]d 1. [redacted] weeks gestation of pregnancy - Glucose Tolerance, 2 Hours w/1 Hour - RPR - HIV antibody (with reflex) - CBC  2. Supervision of other normal pregnancy, antepartum FHT and FH normal. Weight gain normal.  - Glucose Tolerance, 2 Hours w/1 Hour - RPR - HIV antibody (with reflex) - CBC  3. Hypertension affecting pregnancy, antepartum Controlled. Continue ASA 81mg  Has today.  Preterm labor symptoms and general obstetric  precautions including but not limited to vaginal bleeding, contractions, leaking of fluid and fetal movement were reviewed in detail with the patient. Please refer to After Visit Summary for other counseling recommendations.   No follow-ups on file.  Future Appointments  Date Time Provider Department Center  08/21/2020 11:15 AM [facility] NURSE [facility]  08/21/2020 11:30 AM [facility] US3 [facility]  09/04/2020  9:15 AM 11/04/2020, DO [facility] None  09/18/2020  9:15 AM 09/20/2020, DO [facility] None  10/02/2020  8:30 AM 12/02/2020, DO [facility] None  10/16/2020  8:30 AM 10/18/2020, DO [facility] None  10/23/2020  8:30 AM 10/25/2020, DO [facility] None    Levie Heritage, DO

## 2020-08-22 LAB — CBC
Hematocrit: 35.1 % (ref 34.0–46.6)
Hemoglobin: 11.6 g/dL (ref 11.1–15.9)
MCH: 29 pg (ref 26.6–33.0)
MCHC: 33 g/dL (ref 31.5–35.7)
MCV: 88 fL (ref 79–97)
Platelets: 362 10*3/uL (ref 150–450)
RBC: 4 x10E6/uL (ref 3.77–5.28)
RDW: 12.5 % (ref 11.7–15.4)
WBC: 9.6 10*3/uL (ref 3.4–10.8)

## 2020-08-22 LAB — GLUCOSE TOLERANCE, 2 HOURS W/ 1HR
Glucose, 1 hour: 155 mg/dL (ref 65–179)
Glucose, 2 hour: 120 mg/dL (ref 65–152)
Glucose, Fasting: 80 mg/dL (ref 65–91)

## 2020-08-22 LAB — HIV ANTIBODY (ROUTINE TESTING W REFLEX): HIV Screen 4th Generation wRfx: NONREACTIVE

## 2020-08-22 LAB — RPR: RPR Ser Ql: NONREACTIVE

## 2020-09-04 ENCOUNTER — Ambulatory Visit (INDEPENDENT_AMBULATORY_CARE_PROVIDER_SITE_OTHER): Payer: BC Managed Care – PPO | Admitting: Family Medicine

## 2020-09-04 ENCOUNTER — Other Ambulatory Visit: Payer: Self-pay

## 2020-09-04 VITALS — BP 112/70 | HR 90 | Wt 175.0 lb

## 2020-09-04 DIAGNOSIS — O169 Unspecified maternal hypertension, unspecified trimester: Secondary | ICD-10-CM

## 2020-09-04 DIAGNOSIS — Z3A30 30 weeks gestation of pregnancy: Secondary | ICD-10-CM

## 2020-09-04 DIAGNOSIS — Z3480 Encounter for supervision of other normal pregnancy, unspecified trimester: Secondary | ICD-10-CM

## 2020-09-04 DIAGNOSIS — R772 Abnormality of alphafetoprotein: Secondary | ICD-10-CM

## 2020-09-04 NOTE — Progress Notes (Signed)
   PRENATAL VISIT NOTE  Subjective:  Angela Pollard is a 35 y.o. G2P1001 at [redacted]w[redacted]d being seen today for ongoing prenatal care.  She is currently monitored for the following issues for this high-risk pregnancy and has Essential hypertension; Infertility, female; Supervision of other normal pregnancy, antepartum; and Hypertension affecting pregnancy, antepartum on their problem list.  Patient reports no complaints.  Contractions: Not present. Vag. Bleeding: None.  Movement: Present. Denies leaking of fluid.   The following portions of the patient's history were reviewed and updated as appropriate: allergies, current medications, past family history, past medical history, past social history, past surgical history and problem list.   Objective:   Vitals:   09/04/20 0913  BP: 112/70  Pulse: 90  Weight: 175 lb (79.4 kg)    Fetal Status: Fetal Heart Rate (bpm): 143 Fundal Height: 29 cm Movement: Present     General:  Alert, oriented and cooperative. Patient is in no acute distress.  Skin: Skin is warm and dry. No rash noted.   Cardiovascular: Normal heart rate noted  Respiratory: Normal respiratory effort, no problems with respiration noted  Abdomen: Soft, gravid, appropriate for gestational age.  Pain/Pressure: Present     Pelvic: Cervical exam deferred        Extremities: Normal range of motion.  Edema: None  Mental Status: Normal mood and affect. Normal behavior. Normal judgment and thought content.   Assessment and Plan:  Pregnancy: G2P1001 at [redacted]w[redacted]d 1. [redacted] weeks gestation of pregnancy FHT and FH normal  2. Supervision of other normal pregnancy, antepartum FHT and FH normal.  3. Hypertension affecting pregnancy, antepartum Normal growth. Antenatal testing at 32 weeks BP normal  4. Elevated AFP Normal growth. No spina bifida. No signs of preterm labor.  Preterm labor symptoms and general obstetric precautions including but not limited to vaginal bleeding, contractions,  leaking of fluid and fetal movement were reviewed in detail with the patient. Please refer to After Visit Summary for other counseling recommendations.   No follow-ups on file.  Future Appointments  Date Time Provider Department Center  09/18/2020  9:15 AM Levie Heritage, DO [facility] None  09/19/2020 11:00 AM [facility] NURSE [facility]  09/19/2020 11:15 AM [facility] US2 [facility]  09/26/2020 11:00 AM [facility] NURSE [facility]  09/26/2020 11:15 AM [facility] US2 [facility]  10/02/2020  8:30 AM Levie Heritage, DO [facility] None  10/16/2020  8:30 AM Levie Heritage, DO [facility] None  10/23/2020  8:30 AM Levie Heritage, DO [facility] None    Levie Heritage, DO

## 2020-09-18 ENCOUNTER — Other Ambulatory Visit: Payer: Self-pay

## 2020-09-18 ENCOUNTER — Ambulatory Visit (INDEPENDENT_AMBULATORY_CARE_PROVIDER_SITE_OTHER): Payer: BC Managed Care – PPO | Admitting: Family Medicine

## 2020-09-18 VITALS — BP 113/77 | HR 103 | Wt 182.0 lb

## 2020-09-18 DIAGNOSIS — Z3480 Encounter for supervision of other normal pregnancy, unspecified trimester: Secondary | ICD-10-CM

## 2020-09-18 DIAGNOSIS — O169 Unspecified maternal hypertension, unspecified trimester: Secondary | ICD-10-CM

## 2020-09-18 DIAGNOSIS — Z3A32 32 weeks gestation of pregnancy: Secondary | ICD-10-CM

## 2020-09-18 DIAGNOSIS — R772 Abnormality of alphafetoprotein: Secondary | ICD-10-CM

## 2020-09-18 NOTE — Progress Notes (Signed)
   PRENATAL VISIT NOTE  Subjective:  Angela Pollard is a 35 y.o. G2P1001 at [redacted]w[redacted]d being seen today for ongoing prenatal care.  She is currently monitored for the following issues for this high-risk pregnancy and has Essential hypertension; Infertility, female; Supervision of other normal pregnancy, antepartum; and Hypertension affecting pregnancy, antepartum on their problem list.  Patient reports occasional cramping.  Contractions: Not present. Vag. Bleeding: None.  Movement: Present. Denies leaking of fluid.   The following portions of the patient's history were reviewed and updated as appropriate: allergies, current medications, past family history, past medical history, past social history, past surgical history and problem list.   Objective:   Vitals:   09/18/20 0855  BP: 113/77  Pulse: (!) 103  Weight: 182 lb (82.6 kg)    Fetal Status: Fetal Heart Rate (bpm): 128   Movement: Present     General:  Alert, oriented and cooperative. Patient is in no acute distress.  Skin: Skin is warm and dry. No rash noted.   Cardiovascular: Normal heart rate noted  Respiratory: Normal respiratory effort, no problems with respiration noted  Abdomen: Soft, gravid, appropriate for gestational age.  Pain/Pressure: Present     Pelvic: Cervical exam deferred        Extremities: Normal range of motion.  Edema: None  Mental Status: Normal mood and affect. Normal behavior. Normal judgment and thought content.   Assessment and Plan:  Pregnancy: G2P1001 at [redacted]w[redacted]d  1. [redacted] weeks gestation of pregnancy  2. Supervision of other normal pregnancy, antepartum FHT and FH normal  3. Hypertension affecting pregnancy, antepartum BP normal On provardia 60mg  daily  4. Elevated AFP   Preterm labor symptoms and general obstetric precautions including but not limited to vaginal bleeding, contractions, leaking of fluid and fetal movement were reviewed in detail with the patient. Please refer to After Visit  Summary for other counseling recommendations.   No follow-ups on file.  Future Appointments  Date Time Provider Department Center  09/19/2020 11:00 AM [facility]URSE [facility]  09/19/2020 11:15 AM [facility] US2 [facility]  09/26/2020 11:00 AM [facility] NURSE [facility]  09/26/2020 11:15 AM [facility] US2 [facility]  10/02/2020  8:30 AM 12/02/2020, DO [facility] None  10/16/2020  8:30 AM 10/18/2020, DO [facility] None  10/23/2020  8:30 AM 10/25/2020, DO [facility] None    Levie Heritage, DO

## 2020-09-19 ENCOUNTER — Other Ambulatory Visit: Payer: Self-pay | Admitting: Obstetrics

## 2020-09-19 ENCOUNTER — Ambulatory Visit: Payer: No Typology Code available for payment source | Admitting: *Deleted

## 2020-09-19 ENCOUNTER — Encounter: Payer: Self-pay | Admitting: *Deleted

## 2020-09-19 ENCOUNTER — Ambulatory Visit: Payer: No Typology Code available for payment source | Attending: Obstetrics

## 2020-09-19 VITALS — BP 127/82 | HR 87

## 2020-09-19 DIAGNOSIS — O10013 Pre-existing essential hypertension complicating pregnancy, third trimester: Secondary | ICD-10-CM | POA: Diagnosis not present

## 2020-09-19 DIAGNOSIS — O281 Abnormal biochemical finding on antenatal screening of mother: Secondary | ICD-10-CM

## 2020-09-19 DIAGNOSIS — R772 Abnormality of alphafetoprotein: Secondary | ICD-10-CM | POA: Diagnosis not present

## 2020-09-19 DIAGNOSIS — O10913 Unspecified pre-existing hypertension complicating pregnancy, third trimester: Secondary | ICD-10-CM | POA: Diagnosis not present

## 2020-09-19 DIAGNOSIS — Z3A32 32 weeks gestation of pregnancy: Secondary | ICD-10-CM | POA: Diagnosis not present

## 2020-09-19 DIAGNOSIS — O289 Unspecified abnormal findings on antenatal screening of mother: Secondary | ICD-10-CM

## 2020-09-26 ENCOUNTER — Ambulatory Visit: Payer: No Typology Code available for payment source | Attending: Obstetrics

## 2020-09-26 ENCOUNTER — Encounter: Payer: Self-pay | Admitting: *Deleted

## 2020-09-26 ENCOUNTER — Ambulatory Visit: Payer: No Typology Code available for payment source | Admitting: *Deleted

## 2020-09-26 ENCOUNTER — Other Ambulatory Visit: Payer: Self-pay

## 2020-09-26 VITALS — BP 123/84 | HR 94

## 2020-09-26 DIAGNOSIS — O09523 Supervision of elderly multigravida, third trimester: Secondary | ICD-10-CM | POA: Diagnosis not present

## 2020-09-26 DIAGNOSIS — R772 Abnormality of alphafetoprotein: Secondary | ICD-10-CM | POA: Diagnosis not present

## 2020-09-26 DIAGNOSIS — O10013 Pre-existing essential hypertension complicating pregnancy, third trimester: Secondary | ICD-10-CM | POA: Diagnosis not present

## 2020-09-26 DIAGNOSIS — O10913 Unspecified pre-existing hypertension complicating pregnancy, third trimester: Secondary | ICD-10-CM | POA: Diagnosis not present

## 2020-09-26 DIAGNOSIS — Z3A33 33 weeks gestation of pregnancy: Secondary | ICD-10-CM | POA: Diagnosis not present

## 2020-09-26 DIAGNOSIS — O289 Unspecified abnormal findings on antenatal screening of mother: Secondary | ICD-10-CM

## 2020-10-01 ENCOUNTER — Ambulatory Visit: Payer: No Typology Code available for payment source | Admitting: *Deleted

## 2020-10-01 ENCOUNTER — Other Ambulatory Visit: Payer: Self-pay

## 2020-10-01 ENCOUNTER — Ambulatory Visit: Payer: No Typology Code available for payment source | Attending: Obstetrics

## 2020-10-01 ENCOUNTER — Encounter: Payer: Self-pay | Admitting: *Deleted

## 2020-10-01 VITALS — BP 136/89 | HR 98

## 2020-10-01 DIAGNOSIS — Z3A33 33 weeks gestation of pregnancy: Secondary | ICD-10-CM

## 2020-10-01 DIAGNOSIS — O09523 Supervision of elderly multigravida, third trimester: Secondary | ICD-10-CM | POA: Insufficient documentation

## 2020-10-01 DIAGNOSIS — O289 Unspecified abnormal findings on antenatal screening of mother: Secondary | ICD-10-CM | POA: Diagnosis not present

## 2020-10-01 DIAGNOSIS — O10013 Pre-existing essential hypertension complicating pregnancy, third trimester: Secondary | ICD-10-CM | POA: Insufficient documentation

## 2020-10-01 DIAGNOSIS — O281 Abnormal biochemical finding on antenatal screening of mother: Secondary | ICD-10-CM | POA: Diagnosis not present

## 2020-10-02 ENCOUNTER — Ambulatory Visit (INDEPENDENT_AMBULATORY_CARE_PROVIDER_SITE_OTHER): Payer: No Typology Code available for payment source | Admitting: Family Medicine

## 2020-10-02 ENCOUNTER — Other Ambulatory Visit: Payer: Self-pay | Admitting: *Deleted

## 2020-10-02 VITALS — BP 120/80 | HR 101 | Wt 183.0 lb

## 2020-10-02 DIAGNOSIS — R772 Abnormality of alphafetoprotein: Secondary | ICD-10-CM | POA: Diagnosis not present

## 2020-10-02 DIAGNOSIS — O169 Unspecified maternal hypertension, unspecified trimester: Secondary | ICD-10-CM

## 2020-10-02 DIAGNOSIS — Z3A34 34 weeks gestation of pregnancy: Secondary | ICD-10-CM

## 2020-10-02 DIAGNOSIS — Z3480 Encounter for supervision of other normal pregnancy, unspecified trimester: Secondary | ICD-10-CM

## 2020-10-02 DIAGNOSIS — O163 Unspecified maternal hypertension, third trimester: Secondary | ICD-10-CM | POA: Diagnosis not present

## 2020-10-02 DIAGNOSIS — O10913 Unspecified pre-existing hypertension complicating pregnancy, third trimester: Secondary | ICD-10-CM

## 2020-10-02 NOTE — Progress Notes (Signed)
   PRENATAL VISIT NOTE  Subjective:  Angela Pollard is a 35 y.o. G2P1001 at [redacted]w[redacted]d being seen today for ongoing prenatal care.  She is currently monitored for the following issues for this high-risk pregnancy and has Essential hypertension; Infertility, female; Supervision of other normal pregnancy, antepartum; and Hypertension affecting pregnancy, antepartum on their problem list.  Patient reports no complaints.  Contractions: Irritability. Vag. Bleeding: None.  Movement: Present. Denies leaking of fluid.   The following portions of the patient's history were reviewed and updated as appropriate: allergies, current medications, past family history, past medical history, past social history, past surgical history and problem list.   Objective:   Vitals:   10/02/20 0826  BP: 120/80  Pulse: (!) 101  Weight: 183 lb (83 kg)    Fetal Status: Fetal Heart Rate (bpm): 145   Movement: Present     General:  Alert, oriented and cooperative. Patient is in no acute distress.  Skin: Skin is warm and dry. No rash noted.   Cardiovascular: Normal heart rate noted  Respiratory: Normal respiratory effort, no problems with respiration noted  Abdomen: Soft, gravid, appropriate for gestational age.  Pain/Pressure: Present     Pelvic: Cervical exam deferred        Extremities: Normal range of motion.  Edema: Trace  Mental Status: Normal mood and affect. Normal behavior. Normal judgment and thought content.   Assessment and Plan:  Pregnancy: G2P1001 at [redacted]w[redacted]d 1. [redacted] weeks gestation of pregnancy  2. Supervision of other normal pregnancy, antepartum FHT and FH normal  3. Hypertension affecting pregnancy, antepartum BP controlled on nifedipine. BPP yesterday normal Last growth: 3/25 17% (1746g). Next growth 4/21  4. Elevated AFP Normal spine   Preterm labor symptoms and general obstetric precautions including but not limited to vaginal bleeding, contractions, leaking of fluid and fetal movement  were reviewed in detail with the patient. Please refer to After Visit Summary for other counseling recommendations.   No follow-ups on file.  Future Appointments  Date Time Provider Department Center  10/08/2020  3:30 PM [facility] NURSE [facility]  10/08/2020  3:45 PM [facility] US7 [facility]  10/16/2020  8:30 AM Levie Heritage, DO [facility] None  10/16/2020 12:45 PM [facility] NURSE [facility]  10/16/2020  1:00 PM [facility] US1 [facility]  10/23/2020  8:30 AM Levie Heritage, DO [facility] None    Levie Heritage, DO

## 2020-10-08 ENCOUNTER — Encounter: Payer: Self-pay | Admitting: *Deleted

## 2020-10-08 ENCOUNTER — Ambulatory Visit: Payer: No Typology Code available for payment source | Admitting: *Deleted

## 2020-10-08 ENCOUNTER — Other Ambulatory Visit: Payer: Self-pay

## 2020-10-08 ENCOUNTER — Ambulatory Visit: Payer: No Typology Code available for payment source | Attending: Obstetrics

## 2020-10-08 VITALS — BP 114/70 | HR 113

## 2020-10-08 DIAGNOSIS — O10913 Unspecified pre-existing hypertension complicating pregnancy, third trimester: Secondary | ICD-10-CM

## 2020-10-08 DIAGNOSIS — O10013 Pre-existing essential hypertension complicating pregnancy, third trimester: Secondary | ICD-10-CM

## 2020-10-08 DIAGNOSIS — O09523 Supervision of elderly multigravida, third trimester: Secondary | ICD-10-CM | POA: Diagnosis not present

## 2020-10-08 DIAGNOSIS — Z3A34 34 weeks gestation of pregnancy: Secondary | ICD-10-CM

## 2020-10-08 DIAGNOSIS — O289 Unspecified abnormal findings on antenatal screening of mother: Secondary | ICD-10-CM | POA: Diagnosis not present

## 2020-10-10 ENCOUNTER — Inpatient Hospital Stay (HOSPITAL_COMMUNITY)
Admission: AD | Admit: 2020-10-10 | Discharge: 2020-10-10 | Disposition: A | Discharge status: Other Acute Inpatient Hospital | Payer: No Typology Code available for payment source | Attending: Obstetrics & Gynecology | Admitting: Obstetrics & Gynecology

## 2020-10-10 ENCOUNTER — Other Ambulatory Visit: Payer: Self-pay

## 2020-10-10 ENCOUNTER — Encounter (HOSPITAL_COMMUNITY): Payer: Self-pay | Admitting: Obstetrics & Gynecology

## 2020-10-10 DIAGNOSIS — Z79899 Other long term (current) drug therapy: Secondary | ICD-10-CM | POA: Insufficient documentation

## 2020-10-10 DIAGNOSIS — O10013 Pre-existing essential hypertension complicating pregnancy, third trimester: Secondary | ICD-10-CM | POA: Diagnosis not present

## 2020-10-10 DIAGNOSIS — O09523 Supervision of elderly multigravida, third trimester: Secondary | ICD-10-CM | POA: Insufficient documentation

## 2020-10-10 DIAGNOSIS — O10913 Unspecified pre-existing hypertension complicating pregnancy, third trimester: Secondary | ICD-10-CM | POA: Insufficient documentation

## 2020-10-10 DIAGNOSIS — Z7982 Long term (current) use of aspirin: Secondary | ICD-10-CM | POA: Diagnosis not present

## 2020-10-10 DIAGNOSIS — O10919 Unspecified pre-existing hypertension complicating pregnancy, unspecified trimester: Secondary | ICD-10-CM

## 2020-10-10 DIAGNOSIS — O42113 Preterm premature rupture of membranes, onset of labor more than 24 hours following rupture, third trimester: Secondary | ICD-10-CM | POA: Diagnosis not present

## 2020-10-10 DIAGNOSIS — D62 Acute posthemorrhagic anemia: Secondary | ICD-10-CM | POA: Diagnosis not present

## 2020-10-10 DIAGNOSIS — O1092 Unspecified pre-existing hypertension complicating childbirth: Secondary | ICD-10-CM | POA: Diagnosis not present

## 2020-10-10 DIAGNOSIS — Z3689 Encounter for other specified antenatal screening: Secondary | ICD-10-CM

## 2020-10-10 DIAGNOSIS — Z87891 Personal history of nicotine dependence: Secondary | ICD-10-CM | POA: Diagnosis not present

## 2020-10-10 DIAGNOSIS — Z3A35 35 weeks gestation of pregnancy: Secondary | ICD-10-CM | POA: Diagnosis not present

## 2020-10-10 DIAGNOSIS — O42013 Preterm premature rupture of membranes, onset of labor within 24 hours of rupture, third trimester: Secondary | ICD-10-CM | POA: Diagnosis not present

## 2020-10-10 DIAGNOSIS — O9903 Anemia complicating the puerperium: Secondary | ICD-10-CM | POA: Diagnosis not present

## 2020-10-10 DIAGNOSIS — O42919 Preterm premature rupture of membranes, unspecified as to length of time between rupture and onset of labor, unspecified trimester: Secondary | ICD-10-CM

## 2020-10-10 DIAGNOSIS — O1003 Pre-existing essential hypertension complicating the puerperium: Secondary | ICD-10-CM | POA: Diagnosis not present

## 2020-10-10 DIAGNOSIS — Z9889 Other specified postprocedural states: Secondary | ICD-10-CM | POA: Diagnosis not present

## 2020-10-10 DIAGNOSIS — Z20822 Contact with and (suspected) exposure to covid-19: Secondary | ICD-10-CM | POA: Diagnosis not present

## 2020-10-10 DIAGNOSIS — O1002 Pre-existing essential hypertension complicating childbirth: Secondary | ICD-10-CM | POA: Diagnosis not present

## 2020-10-10 DIAGNOSIS — O42913 Preterm premature rupture of membranes, unspecified as to length of time between rupture and onset of labor, third trimester: Secondary | ICD-10-CM | POA: Diagnosis not present

## 2020-10-10 DIAGNOSIS — O9902 Anemia complicating childbirth: Secondary | ICD-10-CM | POA: Diagnosis not present

## 2020-10-10 LAB — RESP PANEL BY RT-PCR (FLU A&B, COVID) ARPGX2
Influenza A by PCR: NEGATIVE
Influenza B by PCR: NEGATIVE
SARS Coronavirus 2 by RT PCR: NEGATIVE

## 2020-10-10 LAB — POCT FERN TEST: POCT Fern Test: POSITIVE

## 2020-10-10 MED ORDER — NIFEDIPINE ER OSMOTIC RELEASE 30 MG PO TB24
60.0000 mg | ORAL_TABLET | Freq: Once | ORAL | Status: AC
  Administered 2020-10-10: 60 mg via ORAL
  Filled 2020-10-10: qty 2

## 2020-10-10 MED ORDER — BETAMETHASONE SOD PHOS & ACET 6 (3-3) MG/ML IJ SUSP
12.0000 mg | Freq: Once | INTRAMUSCULAR | Status: AC
  Administered 2020-10-10: 12 mg via INTRAMUSCULAR
  Filled 2020-10-10: qty 5

## 2020-10-10 NOTE — MAU Note (Addendum)
PT SAYS AT 638PM- WAS LAYING IN BED- AND HAD GUSH OF FLUID.  SAYS SMALL AMT HAS COME OUT SINCE .  [facility] - DR Adrian Blackwater  DENIIES HSV AND MRSA.  NO PAIN/UC

## 2020-10-10 NOTE — MAU Note (Signed)
Fern slide collected-perineum wet.

## 2020-10-10 NOTE — MAU Provider Note (Signed)
History     CSN: [id number]  Arrival date and time: 10/10/20 1925   Event Date/Time   First Provider Initiated Contact with Patient 10/10/20 2019      Chief Complaint  Patient presents with  . Rupture of Membranes   Angela Pollard is a 35 y.o. G2P1001 at [redacted]w[redacted]d who receives care at [facility].  She presents today for Rupture of Membranes.  She states she was laying in bed at around 1830 and had a gush of fluid.  She states she had no urgency to urinate, at the time, and noted that it was clear and odorless.  She states she went to the bathroom shortly after and   She denies pain or contractions. She endorses fetal movement.  She reports some light pinkish discharge that she noted on arrival. She endorses a history of CHTN and states she takes procardia at night.  She has not taken her dosage today.    OB History    Gravida  2   Para  1   Term  1   Preterm      AB      Living  1     SAB      IAB      Ectopic      Multiple      Live Births  1           Past Medical History:  Diagnosis Date  . Hypertension    x 4-5 years    Past Surgical History:  Procedure Laterality Date  . NO PAST SURGERIES      Family History  Problem Relation Age of Onset  . Brain cancer Mother 39  . Cancer Mother 19       brain  . Kidney failure Father   . CVA Father   . Hypertension Father   . Diabetes Mellitus II Father   . Hyperlipidemia Father   . Diabetes Mellitus II Half-Sister   . Heart failure Maternal Aunt   . Diabetes Neg Hx     Social History   Tobacco Use  . Smoking status: Former Smoker    Packs/day: 0.30    Years: 8.00    Pack years: 2.40  . Smokeless tobacco: Never Used  Vaping Use  . Vaping Use: Never used  Substance Use Topics  . Alcohol use: Not Currently    Comment: occasional  . Drug use: Never    Allergies: No Known Allergies  Medications Prior to Admission  Medication Sig Dispense Refill Last Dose  . aspirin EC 81 MG tablet Take 81 mg  by mouth daily. Swallow whole.   10/09/2020 at Unknown time  . cetirizine (ZYRTEC) 10 MG tablet Take 10 mg by mouth daily.   10/10/2020 at Unknown time  . folic acid (FOLVITE) 0.5 MG tablet Take 0.5 mg by mouth daily.   10/09/2020 at Unknown time  . NIFEdipine (PROCARDIA XL/NIFEDICAL XL) 60 MG 24 hr tablet TAKE 1 TABLET (60 MG TOTAL) BY MOUTH DAILY. 90 tablet 1 10/09/2020 at Unknown time  . Prenatal Vit-Fe Fumarate-FA (PRENATAL VITAMINS PO) Take by mouth.   10/09/2020 at Unknown time    Review of Systems  Constitutional: Negative for chills and fever.  Eyes: Negative for visual disturbance.  Respiratory: Negative for cough and shortness of breath.   Gastrointestinal: Positive for nausea. Negative for abdominal pain and vomiting.  Genitourinary: Positive for pelvic pain (None more than usual). Negative for difficulty urinating, dysuria, vaginal bleeding and vaginal discharge.  Neurological: Negative for dizziness, light-headedness and headaches.   Physical Exam   Blood pressure (!) 141/95, pulse 86, temperature 98.2 F (36.8 C), temperature source Oral, resp. rate 20, height 5\' 6"  (1.676 m), weight 84 kg, last menstrual period 02/07/2020, SpO2 100 %.  Physical Exam Vitals reviewed. Exam conducted with a chaperone present.  Constitutional:      Appearance: Normal appearance.  HENT:     Head: Normocephalic and atraumatic.  Eyes:     Conjunctiva/sclera: Conjunctivae normal.  Cardiovascular:     Rate and Rhythm: Normal rate and regular rhythm.     Heart sounds: Normal heart sounds.  Pulmonary:     Effort: Pulmonary effort is normal.     Breath sounds: Normal breath sounds.  Abdominal:     Palpations: Abdomen is soft.     Tenderness: There is no abdominal tenderness.     Comments: Gravid  Genitourinary:    Comments: Active leaking of fluid from vagina.  Clear, No apparent odor.  Dilation: 2 Effacement (%): 50 Station: Ballotable Presentation: Vertex Exam by:: [email or web address] CNM    Musculoskeletal:        General: Normal range of motion.     Cervical back: Normal range of motion.  Skin:    General: Skin is warm and dry.  Neurological:     Mental Status: She is alert and oriented to person, place, and time.     Fetal Assessment 140 bpm, Mod Var, -Decels, +Accels Toco: No ctx graphed  MAU Course   Results for orders placed or performed during the hospital encounter of 10/10/20 (from the past 24 hour(s))  POCT fern test     Status: Abnormal   Collection Time: 10/10/20  8:44 PM  Result Value Ref Range   POCT Fern Test Positive = ruptured amniotic membanes    No results found.  MDM PE Labs: None EFM  Assessment and Plan  35 year old G2P1001  SIUP at 35.1weeks Cat I FT Leakage of Fluid CHTN  -Patient reports active leaking while provider at bedside. -Assessment of vaginal area shows active leaking from os c/w gross rupture. -Patient informed of ROM. -Patient informed that fern collected by nurse will be viewed.   -Will give procardia dosing.  -Nurse reports fern positive. -Dr. 31 consulted and informs provider that NICU is full and transfer necessary. Give BMZ dosing. -Patient updated on plan of care and is agreeable. -Expresses anxiety, but has no questions.  Macon Large MSN, CNM 10/10/2020, 8:20 PM   Reassessment (21:38 PM)   -N.Nugent, NP reports Dr. 10/12/2020 accepts transfer to [facility].  Requests: *Confirm Presentation *Perform Cervical Exam -Provider back to bedside to update patient on POC and transfer status. -Exam as noted and confirms vertex.   -Nurse to facilitate completion of TOC. -EMTLA completed by provider.  [facility] MSN, CNM Advanced Practice Provider, Center for Cherre Robins

## 2020-10-10 NOTE — MAU Note (Addendum)
SVE per Clyde Lundborg CNM-pt continues to leak small amount of  clear amniotic fluid.Pt denies cramping or contractions.

## 2020-10-10 NOTE — MAU Note (Signed)
Pt returned from bathroom-pericare per pt-clear amniotic fluid noted on chux

## 2020-10-10 NOTE — Progress Notes (Addendum)
  Patient stable. No reports of pain or contractions. +FM.  NST reactive.   Cleared for Transport and Transfer as planned.   Cherre Robins MSN, CNM Advanced Practice Provider, Center for [employer]

## 2020-10-11 DIAGNOSIS — O4290 Premature rupture of membranes, unspecified as to length of time between rupture and onset of labor, unspecified weeks of gestation: Secondary | ICD-10-CM

## 2020-10-13 ENCOUNTER — Other Ambulatory Visit (HOSPITAL_BASED_OUTPATIENT_CLINIC_OR_DEPARTMENT_OTHER): Payer: Self-pay

## 2020-10-13 ENCOUNTER — Telehealth: Payer: Self-pay | Admitting: General Practice

## 2020-10-13 MED ORDER — FERROUS SULFATE 325 (65 FE) MG PO TABS
ORAL_TABLET | ORAL | 0 refills | Status: DC
  Filled 2020-10-13: qty 30, 30d supply, fill #0

## 2020-10-13 NOTE — Telephone Encounter (Signed)
Patient delivered at [facility] on 10/11/2020 via c/s due to NICU was full.  Pt is scheduled for 1 week f/u for incision and blood pressure check.

## 2020-10-16 ENCOUNTER — Encounter: Payer: BC Managed Care – PPO | Admitting: Family Medicine

## 2020-10-16 ENCOUNTER — Ambulatory Visit: Payer: No Typology Code available for payment source

## 2020-10-23 ENCOUNTER — Ambulatory Visit: Payer: No Typology Code available for payment source

## 2020-10-23 ENCOUNTER — Encounter: Payer: Self-pay | Admitting: Family Medicine

## 2020-10-23 ENCOUNTER — Other Ambulatory Visit (HOSPITAL_BASED_OUTPATIENT_CLINIC_OR_DEPARTMENT_OTHER): Payer: Self-pay

## 2020-10-23 ENCOUNTER — Other Ambulatory Visit: Payer: Self-pay

## 2020-10-23 ENCOUNTER — Ambulatory Visit (INDEPENDENT_AMBULATORY_CARE_PROVIDER_SITE_OTHER): Payer: No Typology Code available for payment source | Admitting: Family Medicine

## 2020-10-23 VITALS — BP 140/86 | HR 92 | Wt 173.0 lb

## 2020-10-23 DIAGNOSIS — Z98891 History of uterine scar from previous surgery: Secondary | ICD-10-CM | POA: Diagnosis not present

## 2020-10-23 DIAGNOSIS — I10 Essential (primary) hypertension: Secondary | ICD-10-CM

## 2020-10-23 MED ORDER — LORAZEPAM 1 MG PO TABS
1.0000 mg | ORAL_TABLET | Freq: Once | ORAL | 0 refills | Status: AC
  Filled 2020-10-23 – 2020-11-04 (×2): qty 1, 1d supply, fill #0

## 2020-10-23 NOTE — Progress Notes (Signed)
   Subjective:   Patient Name: Angela Pollard, female   DOB: February 10, 1986, 35 y.o.  MRN: [id number]  HPI  Patient is 2 weeks postop from a LTCS on 4/16. She is here for a wound check. She was transferred to [facility] due to high census in NICU after PROM. Was placed on pitocin, without dilation. Cesarean due to NRFHT. Complaints: mild constipation improved with Colace.   Review of Systems  Constitutional: Negative for chills and fever.  Gastrointestinal: Negative for abdominal pain, diarrhea, nausea and vomiting.  Genitourinary: Negative for dysuria, pelvic pain and vaginal discharge.       Objective:  BP 140/86   Pulse 92   Wt 173 lb (78.5 kg)   LMP 02/07/2020 (Exact Date)   BMI 27.92 kg/m    Physical Exam  Constitutional: She appears well-developed and well-nourished.  Eyes: Pupils are equal, round, and reactive to light.  Cardiovascular: Normal rate.   Pulmonary/Chest: Effort normal.  Abdominal: Soft. She exhibits no distension. There is no tenderness. There is no rebound and no guarding.  Incision clean, dry intact.   Skin: Skin is warm and dry.  Psychiatric: She has a normal mood and affect. Her behavior is normal. Judgment and thought content normal.      Assessment & Plan:    Pregnancy: G2P1001 Patient Active Problem List   Diagnosis Date Noted  . Elevated AFP 10/02/2020  . Supervision of other normal pregnancy, antepartum 04/03/2020  . Hypertension affecting pregnancy, antepartum 04/03/2020  . Infertility, female 02/20/2020  . Essential hypertension 01/02/2019    1. Status post cesarean delivery Incision intact. F/u in 2 weeks.  2. Essential hypertension On procardia.   Levie Heritage, DO

## 2020-10-30 ENCOUNTER — Ambulatory Visit: Payer: No Typology Code available for payment source

## 2020-10-30 ENCOUNTER — Other Ambulatory Visit (HOSPITAL_BASED_OUTPATIENT_CLINIC_OR_DEPARTMENT_OTHER): Payer: Self-pay

## 2020-10-30 ENCOUNTER — Other Ambulatory Visit: Payer: No Typology Code available for payment source

## 2020-10-30 ENCOUNTER — Encounter: Payer: No Typology Code available for payment source | Admitting: Family Medicine

## 2020-11-04 ENCOUNTER — Other Ambulatory Visit (HOSPITAL_BASED_OUTPATIENT_CLINIC_OR_DEPARTMENT_OTHER): Payer: Self-pay

## 2020-11-04 MED FILL — Nifedipine Tab ER 24HR Osmotic Release 60 MG: ORAL | 90 days supply | Qty: 90 | Fill #0 | Status: AC

## 2020-11-06 ENCOUNTER — Encounter: Payer: No Typology Code available for payment source | Admitting: Family Medicine

## 2020-11-07 ENCOUNTER — Ambulatory Visit (INDEPENDENT_AMBULATORY_CARE_PROVIDER_SITE_OTHER): Payer: No Typology Code available for payment source | Admitting: Obstetrics & Gynecology

## 2020-11-07 ENCOUNTER — Other Ambulatory Visit (HOSPITAL_BASED_OUTPATIENT_CLINIC_OR_DEPARTMENT_OTHER): Payer: Self-pay

## 2020-11-07 ENCOUNTER — Other Ambulatory Visit: Payer: Self-pay

## 2020-11-07 ENCOUNTER — Encounter: Payer: Self-pay | Admitting: Obstetrics & Gynecology

## 2020-11-07 VITALS — BP 116/73 | HR 73 | Wt 169.0 lb

## 2020-11-07 DIAGNOSIS — O9122 Nonpurulent mastitis associated with the puerperium: Secondary | ICD-10-CM

## 2020-11-07 MED ORDER — AMOXICILLIN-POT CLAVULANATE 875-125 MG PO TABS
1.0000 | ORAL_TABLET | Freq: Two times a day (BID) | ORAL | 0 refills | Status: DC
  Filled 2020-11-07: qty 10, 5d supply, fill #0

## 2020-11-07 NOTE — Patient Instructions (Signed)
Mastitis  Mastitis is inflammation of the breast tissue. It occurs most often in women who are breastfeeding, but it can also affect other women, and sometimes even men. What are the causes? This condition is usually caused by a bacterial infection. Bacteria enter the breast tissue through cuts or openings in the skin. Typically, this occurs with breastfeeding because of cracked or irritated nipples. Sometimes, it can occur when there is no opening in the skin. This is usually caused by plugged milk ducts. Other causes include:  Nipple piercing.  Some forms of breast cancer. What are the signs or symptoms? Symptoms of this condition include:  Swelling, redness, tenderness, and pain in an area of the breast. The area may also feel warm to the touch. These symptoms usually affect the upper part of the breast, toward the armpit region.  Swelling of the glands under the arm on the same side.  Fever.  Rapid pulse.  Fatigue, headache, and flu-like muscle aches. If an infection is allowed to progress, a collection of pus (abscess) may develop. How is this diagnosed? This condition can usually be diagnosed based on a physical exam and your symptoms. You may also have other tests, such as:  Blood tests to determine if your body is fighting a bacterial infection.  Mammogram or ultrasound tests to rule out other problems or diseases.  Testing of pus and other fluids. Pus from the breast may be collected and examined in the lab. If an abscess has developed, the fluid in the abscess can be removed with a needle. This test can be used to confirm the diagnosis and identify the bacteria present.  If you are breastfeeding, breast milk may be cultured and tested for bacteria. How is this treated? Treatment for this condition may include:  Applying heat or cold compresses to the affected area.  Medicine for pain.  Antibiotic medicine to treat a bacterial infection. This is usually taken by  mouth.  Self-care such as rest and increased fluid intake.  If an abscess has developed, it may be treated by removing fluid with a needle. Mastitis that occurs with breastfeeding will sometimes go away on its own, so your health care provider may choose to wait 24 hours after first seeing you to decide whether a prescription medicine is needed. You may be told of different ways to help manage breastfeeding, such as continuing to breastfeed or pump in order to ensure adequate milk flow. Follow these instructions at home: Medicines  Take over-the-counter and prescription medicines only as told by your health care provider.  If you were prescribed an antibiotic medicine, take it as told by your health care provider. Do not stop taking the antibiotic even if you start to feel better. General instructions  Do not wear a tight or underwire bra. Wear a soft, supportive bra.  Increase your fluid intake, especially if you have a fever.  Get plenty of rest. If you are breastfeeding:  Continue to empty your breasts as often as possible either by breastfeeding or by using a breast pump. This will decrease the pressure and the pain that comes with it. Ask your health care provider if changes need to be made to your breastfeeding or pumping routine.  Keep your nipples clean and dry.  During breastfeeding, empty the first breast completely before going to the other breast. If your baby is not emptying your breasts completely, use a breast pump to empty your breasts.  Use breast massage during feeding or pumping sessions.    If directed, apply moist heat to the affected area of your breast right before breastfeeding or pumping. Use the heat source that your health care provider recommends.  If directed, put ice on the affected area of your breast right after breastfeeding or pumping: ? Put ice in a plastic bag. ? Place a towel between your skin and the bag. ? Leave the ice on for 20 minutes.  If  you go back to work, pump your breasts while at work to stay within your nursing schedule.  Avoid allowing your breasts to become overly filled with milk (engorged). Contact a health care provider if:  You have pus-like discharge from the breast.  You have a fever.  Your symptoms do not improve within 2 days of starting treatment. Get help right away if:  Your pain and swelling are getting worse.  You have pain that is not controlled with medicine.  You have a red line extending from the breast toward your armpit. Summary  Mastitis is inflammation of the breast tissue. It occurs most often in women who are breastfeeding, but it can also affect non-breastfeeding women and some men.  This condition is usually caused by a bacterial infection.  This condition may be treated with hot and cold compresses, medicines, self-care, and certain breastfeeding strategies.  If you were prescribed an antibiotic medicine, take it as told by your health care provider. Do not stop taking the antibiotic even if you start to feel better. This information is not intended to replace advice given to you by your health care provider. Make sure you discuss any questions you have with your health care provider. Document Revised: 05/27/2017 Document Reviewed: 07/06/2016 Elsevier Patient Education  2021 Elsevier Inc.  

## 2020-11-07 NOTE — Progress Notes (Signed)
   POSTPARTUM PROBLEM VISIT NOTE  History:   Angela Pollard is a 35 y.o. G3O7564 here today for evaluation of right breast pain and lactation issues. Reports having right breast issues since her delivery, had always had less output from this breast. Recently, she has noticed a lump that has been more and more painful, and now has redness on top of it. Has tried different interventions suggested by lactational consultant, but no prescribed medications. None of these helped.  Ibuprofen and Tylenol helped with the pain.  She denies any abnormal vaginal discharge, bleeding, pelvic pain or other concerns.    The following portions of the patient's history were reviewed and updated as appropriate: allergies, current medications, past family history, past medical history, past social history, past surgical history and problem list.   Review of Systems:  Pertinent items noted in HPI and remainder of comprehensive ROS otherwise negative.  Physical Exam:  BP 116/73   Pulse 73   Wt 169 lb (76.7 kg)   LMP 02/07/2020 (Exact Date)   BMI 27.28 kg/m  CONSTITUTIONAL: Well-developed, well-nourished female in no acute distress.  NEUROLOGIC: Alert and oriented to person, place, and time. Normal reflexes, muscle tone coordination.  PSYCHIATRIC: Normal mood and affect. Normal behavior. Normal judgment and thought content. CARDIOVASCULAR: Normal heart rate noted, regular rhythm RESPIRATORY: Effort and breath sounds normal, no problems with respiration noted. BREASTS: Symmetric in size. Engorged bilateral breasts, area of tenderness in right inner lower quadrant. This area also had some blanching erythema. No discrete mass palpated as breast is overall engorged. No other masses, tenderness, skin changes, nipple drainage, or lymphadenopathy bilaterally. ABDOMEN: Soft, no distention noted.     Assessment and Plan:      1. Mastitis during puerperium of right breast Augmentin prescribed, will reevaluate in one  week during scheduled postpartum visit - amoxicillin-clavulanate (AUGMENTIN) 875-125 MG tablet; Take 1 tablet by mouth 2 (two) times daily.  Dispense: 10 tablet; Refill: 0 Discussed other interventions to help with mastitis including frequent pumping/feeding, warm compresses, information given to her to review at home.  Will follow up with lactation consultants also. If symptoms worsen, may need evaluation for breast abscess.  Please refer to After Visit Summary for other counseling recommendations.   Return for Postpartum check, Followup as scheduled.     I spent 15 minutes dedicated to the care of this patient including pre-visit review of records, face to face time with the patient discussing her conditions and treatments.   Jaynie Collins, MD, FACOG Obstetrician & Gynecologist, [facility] for [employer], [facility]

## 2020-11-10 ENCOUNTER — Other Ambulatory Visit (HOSPITAL_COMMUNITY): Payer: Self-pay

## 2020-11-12 ENCOUNTER — Ambulatory Visit (INDEPENDENT_AMBULATORY_CARE_PROVIDER_SITE_OTHER): Payer: No Typology Code available for payment source | Admitting: Family Medicine

## 2020-11-12 ENCOUNTER — Other Ambulatory Visit: Payer: Self-pay

## 2020-11-12 ENCOUNTER — Encounter: Payer: Self-pay | Admitting: Family Medicine

## 2020-11-12 ENCOUNTER — Encounter: Payer: Self-pay | Admitting: General Practice

## 2020-11-12 VITALS — BP 128/86 | HR 68 | Wt 165.0 lb

## 2020-11-12 DIAGNOSIS — I10 Essential (primary) hypertension: Secondary | ICD-10-CM

## 2020-11-12 DIAGNOSIS — Z8759 Personal history of other complications of pregnancy, childbirth and the puerperium: Secondary | ICD-10-CM

## 2020-11-12 DIAGNOSIS — Z01812 Encounter for preprocedural laboratory examination: Secondary | ICD-10-CM | POA: Diagnosis not present

## 2020-11-12 DIAGNOSIS — Z30430 Encounter for insertion of intrauterine contraceptive device: Secondary | ICD-10-CM

## 2020-11-12 DIAGNOSIS — Z3202 Encounter for pregnancy test, result negative: Secondary | ICD-10-CM

## 2020-11-12 LAB — POCT URINE PREGNANCY: Preg Test, Ur: NEGATIVE

## 2020-11-12 MED ORDER — LEVONORGESTREL 20.1 MCG/DAY IU IUD
1.0000 | INTRAUTERINE_SYSTEM | Freq: Once | INTRAUTERINE | Status: AC
  Administered 2020-11-12: 1 via INTRAUTERINE

## 2020-11-12 NOTE — Progress Notes (Signed)
Post Partum Visit Note  Angela Pollard is a 35 y.o. G12P1102 female who presents for a postpartum visit. She is 4 weeks postpartum following a primary cesarean section.  I have fully reviewed the prenatal and intrapartum course. The delivery was at 35 gestational weeks.  Anesthesia: spinal. Postpartum course has been normal. Baby is doing well. Baby is feeding by both breast and bottle - neosure . Bleeding staining only. Bowel function is normal. Bladder function is normal. Patient is not sexually active. Contraception method is IUD. Postpartum depression screening: negative.   The pregnancy intention screening data noted above was reviewed. Potential methods of contraception were discussed. The patient elected to proceed with IUD or IUS.    Edinburgh Postnatal Depression Scale - 11/12/20 0950      Edinburgh Postnatal Depression Scale:  In the Past 7 Days   I have been able to laugh and see the funny side of things. 0    I have looked forward with enjoyment to things. 0    I have blamed myself unnecessarily when things went wrong. 1    I have been anxious or worried for no good reason. 2    I have felt scared or panicky for no good reason. 2    Things have been getting on top of me. 2    I have been so unhappy that I have had difficulty sleeping. 0    I have felt sad or miserable. 0    I have been so unhappy that I have been crying. 0    The thought of harming myself has occurred to me. 0    Edinburgh Postnatal Depression Scale Total 7           Edinburgh Postnatal Depression Scale - 11/12/20 0950      Edinburgh Postnatal Depression Scale:  In the Past 7 Days   I have been able to laugh and see the funny side of things. 0    I have looked forward with enjoyment to things. 0    I have blamed myself unnecessarily when things went wrong. 1    I have been anxious or worried for no good reason. 2    I have felt scared or panicky for no good reason. 2    Things have been getting on  top of me. 2    I have been so unhappy that I have had difficulty sleeping. 0    I have felt sad or miserable. 0    I have been so unhappy that I have been crying. 0    The thought of harming myself has occurred to me. 0    Edinburgh Postnatal Depression Scale Total 7           Health Maintenance Due  Topic Date Due  . COVID-19 Vaccine (1) Never done    The following portions of the patient's history were reviewed and updated as appropriate: allergies, current medications, past family history, past medical history, past social history, past surgical history and problem list.  Review of Systems Pertinent items are noted in HPI.  Objective:  BP 128/86   Pulse 68   Wt 165 lb (74.8 kg)   LMP 02/07/2020 (Exact Date)   BMI 26.63 kg/m    General:  alert, cooperative and no distress  Lungs: clear to auscultation bilaterally  Heart:  regular rate and rhythm, S1, S2 normal, no murmur, click, rub or gallop  Abdomen: soft, non-tender; bowel sounds normal; no masses,  no organomegaly   Wound well approximated incision  GU exam:  normal       IUD Procedure Note Patient identified, informed consent performed, signed copy in chart, time out was performed.  Urine pregnancy test negative.  Speculum placed in the vagina.  Cervix visualized.  Cleaned with Betadine x 2.  Paracervical block placed with Lidocaine 2% with epinephrine 68mL injected at the 12 o'clock. Cervix grasped anteriorly with a single tooth tenaculum.  Uterus sounded to 8 cm.  Liletta  IUD placed per manufacturer's recommendations.  Strings trimmed to 3 cm. Tenaculum was removed, good hemostasis noted.  Patient tolerated procedure well.   Patient given post procedure instructions and Liletta care card with expiration date.  Patient is asked to check IUD strings periodically and follow up in 4-6 weeks for IUD check.      Assessment:    There are no diagnoses linked to this encounter.  1. Pre-procedure lab exam   2.  Postpartum exam   3. History of preterm premature rupture of membranes (PPROM)   4. Essential hypertension      Plan:   Essential components of care per ACOG recommendations:  1.  Mood and well being: Patient with negative depression screening today. Reviewed local resources for support.  - Patient tobacco use? No.   - hx of drug use? No.    2. Infant care and feeding:  -Patient currently breastmilk feeding? Yes. Reviewed importance of draining breast regularly to support lactation.  -Social determinants of health (SDOH) reviewed in EPIC. No concerns  3. Sexuality, contraception and birth spacing - Patient does not want a pregnancy in the next year.   - Reviewed forms of contraception in tiered fashion. Patient desired IUD today.   - Discussed birth spacing of 18 months  4. Sleep and fatigue -Encouraged family/partner/community support of 4 hrs of uninterrupted sleep to help with mood and fatigue  5. Physical Recovery  - Discussed patients delivery and complications. She describes her labor as good. - Patient had a C-section emergent. Patient expressed understanding - Patient has urinary incontinence? No. - Patient is safe to resume physical and sexual activity  6.  Health Maintenance - HM due items addressed Yes - Last pap smear  Diagnosis  Date Value Ref Range Status  01/02/2019   Final   NEGATIVE FOR INTRAEPITHELIAL LESIONS OR MALIGNANCY. BENIGN REACTIVE/REPARATIVE CHANGES.  01/02/2019   Final   FUNGAL ORGANISMS PRESENT CONSISTENT WITH CANDIDA SPP.   Pap smear not done at today's visit.  -Breast Cancer screening indicated? No.   7. Chronic Disease/Pregnancy Condition follow up: Hypertension  - PCP follow up  Levie Heritage, DO Center for [facility], [facility]

## 2020-11-19 ENCOUNTER — Ambulatory Visit (INDEPENDENT_AMBULATORY_CARE_PROVIDER_SITE_OTHER): Payer: No Typology Code available for payment source | Admitting: Family Medicine

## 2020-11-19 ENCOUNTER — Other Ambulatory Visit: Payer: Self-pay

## 2020-11-19 ENCOUNTER — Encounter: Payer: Self-pay | Admitting: Family Medicine

## 2020-11-19 VITALS — BP 146/86 | HR 75 | Ht 66.0 in | Wt 170.0 lb

## 2020-11-19 DIAGNOSIS — N6310 Unspecified lump in the right breast, unspecified quadrant: Secondary | ICD-10-CM | POA: Insufficient documentation

## 2020-11-19 NOTE — Progress Notes (Signed)
Pt presents today due to lump in Right breast. Pt delivered via C/S on 10/11/20 at [facility]. Pt states it is the same lump she was treated for before with antibiotics and has not gone away. States it is not painful. She is still able to pump and breastfeed but supply has gone down.

## 2020-11-19 NOTE — Assessment & Plan Note (Signed)
Likely clogged duct vs galactocele on exam, luckily no signs of mastitis though reviewed those with patient. Will send to breast center for imaging and possible aspiration, breast also very engorged which is likely inhibiting full drainage of that area. Encouraged to continue frequent pumping as well as putting baby to breast on R side as much as possible. Follow up after breast center visit.

## 2020-11-19 NOTE — Progress Notes (Signed)
GYNECOLOGY OFFICE VISIT NOTE  History:   Angela Pollard is a 35 y.o. [phone] here today for lump in breast.  Patient delivered by pLTCS at [facility] last month at 35 weeks Hx of cHTN on Nifedipine Per review of chart was seeing lactation, last encounter on 11/06/2020 notes clogged duct in R breast Seen for acute visit for same on 11/07/2020 by Dr. Macon Large, rx Augmentin  Today reports ongoing lump in R breast Has been doing warm compresses and feeding/pumping frequently but unable to resolve R sided lump Feeds less on R side due to flat nipple Denies nausea, vomiting, or fever Lump is not very painful, only discomfort when being palpated Has been taking motrin and tylenol for pain when she has it which is effective  Health Maintenance Due  Topic Date Due  . COVID-19 Vaccine (1) Never done    Past Medical History:  Diagnosis Date  . Hypertension    x 4-5 years    Past Surgical History:  Procedure Laterality Date  . NO PAST SURGERIES      The following portions of the patient's history were reviewed and updated as appropriate: allergies, current medications, past family history, past medical history, past social history, past surgical history and problem list.   Health Maintenance:   Last pap: Lab Results  Component Value Date   DIAGPAP  01/02/2019    NEGATIVE FOR INTRAEPITHELIAL LESIONS OR MALIGNANCY. BENIGN REACTIVE/REPARATIVE CHANGES.   DIAGPAP  01/02/2019    FUNGAL ORGANISMS PRESENT CONSISTENT WITH CANDIDA SPP.   HPV NOT DETECTED 01/02/2019    Last mammogram:  n/a  Review of Systems:  Pertinent items noted in HPI and remainder of comprehensive ROS otherwise negative.  Physical Exam:  BP (!) 146/86 (BP Location: Right Arm, Patient Position: Sitting, Cuff Size: Normal)   Pulse 75   Ht 5\' 6"  (1.676 m)   Wt 170 lb (77.1 kg)   LMP  (LMP Unknown)   Breastfeeding Yes   BMI 27.44 kg/m  CONSTITUTIONAL: Well-developed, well-nourished female in no acute  distress.  HEENT:  Normocephalic, atraumatic. External right and left ear normal. No scleral icterus.  NECK: Normal range of motion, supple, no masses noted on observation SKIN: No rash noted. Not diaphoretic. No erythema. No pallor. MUSCULOSKELETAL: Normal range of motion. No edema noted. NEUROLOGIC: Alert and oriented to person, place, and time. Normal muscle tone coordination.  PSYCHIATRIC: Normal mood and affect. Normal behavior. Normal judgment and thought content. RESPIRATORY: Effort normal, no problems with respiration noted Breast: Firm mass felt from roughly 1-6 o'clock in R breast  Labs and Imaging Results for orders placed or performed in visit on 11/12/20 (from the past 168 hour(s))  POCT urine pregnancy   Collection Time: 11/12/20 10:15 AM  Result Value Ref Range   Preg Test, Ur Negative Negative   No results found.    Assessment and Plan:   Problem List Items Addressed This Visit      Other   Breast mass, right - Primary    Likely clogged duct vs galactocele on exam, luckily no signs of mastitis though reviewed those with patient. Will send to breast center for imaging and possible aspiration, breast also very engorged which is likely inhibiting full drainage of that area. Encouraged to continue frequent pumping as well as putting baby to breast on R side as much as possible. Follow up after breast center visit.       Relevant Orders   11/14/20 BREAST ASPIRATION RIGHT  Routine preventative health maintenance measures emphasized. Please refer to After Visit Summary for other counseling recommendations.   Return if symptoms worsen or fail to improve.    Total face-to-face time with patient: 20 minutes.  Over 50% of encounter was spent on counseling and coordination of care.   Venora Maples, MD/MPH Center for [employer], [facility]

## 2020-11-19 NOTE — Patient Instructions (Signed)
Breast Pumping Tips Breast pumping is a way to get milk out of your breasts. You will then store the milk for your baby to use when you are away from home. There are three ways to pump.  You can use your hand to massage and squeeze your breast (hand expression).  You can use a hand-held machine to manually pump your milk.  You can use an electric machine to pump your milk. In the beginning you may not get much milk. After a few days, your breasts should make more. Pumping can help you start making milk after your baby is born. Pumping helps you to keep making milk when you are away from your baby. When should I pump? You can start pumping soon after your baby is born. Follow these tips:  When you are with your baby: ? Pump after you breastfeed. ? Pump from the free breast while you breastfeed.  When you are away from your baby: ? Pump every 2-3 hours for 15 minutes. ? Pump both breasts at the same time if you can.  If your baby drinks formula, pump around the time your baby gets the formula.  If you drank alcohol, wait 2 hours before you pump.  If you are going to have surgery, ask your doctor when you should pump again. How do I get ready to pump? Try to relax. Try these things to help your milk come in:  Smell your baby's blanket or clothes.  Look at a picture or video of your baby.  Sit in a quiet, private space.  Place a cloth on your breast. The cloth should be warm and a little wet.  Massage your breast and nipple.  Play relaxing music.  Picture your milk flowing.  Drink water and eat a snack. What are some tips? General tips for pumping breast milk  Always wash your hands with soap and water for at least 20 seconds before pumping.  If you do not get much milk or if pumping hurts, try different pump settings or a different kind of pump.  Drink enough fluid so your pee (urine) is clear or pale yellow.  Wear clothing that opens in the front or is easy to take  off.  Pump milk into a clean bottle or container.  Do not smoke or use any products that contain nicotine or tobacco. If you need help quitting, ask your doctor.  Try to get a hands-free pumping bra, if possible. This makes it easy to pump breast milk. You can buy one or make your own.   Tips for storing breast milk  Store breast milk in a clean, BPA-free container. These include: ? A glass or plastic bottle. ? A milk storage bag.  Store only 2-4 ounces of breast milk in each container.  Swirl the breast milk in the container. Do not shake it.  Write down the date you pumped the milk on the container.  This is how long you can store breast milk: ? Room temperature: 6-8 hours. It is best to use the milk within 4 hours. ? Cooler with ice packs: 24 hours. ? Refrigerator: 5-8 days, if the milk is clean. It is best to use the milk within 3 days. ? Freezer: 9-12 months, if the milk is clean and stored away from the freezer door. It is best to use the milk within 6 months.  Put milk in the back of the refrigerator or freezer.  Thaw frozen milk using warm water. Do not  use the microwave.   Tips for choosing a breast pump When choosing a pump, keep the following things in mind:  Manual breast pumps do not need electricity. They cost less. They can be hard to use.  Electric breast pumps use electricity. They are more expensive. They are easier to use. They collect more milk.  The suction cup (flange) should be the right size.  Before you buy the pump, check if your insurance will pay for it. Tips for caring for a breast pump  Check the manual that came with your pump for cleaning tips.  Try not to touch the inside of pump parts.  Clean the pump after you use it. To do this: ? Wipe down the electrical part. Use a dry cloth or paper towel. Do not put this part in water or in cleaning products. ? Wash the plastic parts with soap and warm water. Or use the dishwasher if the manual  says it is safe. You do not need to clean the tubing unless it touched breast milk. ? Let all the parts air dry. Avoid drying them with a cloth or towel. ? When the parts are clean and dry, put the pump back together. Then store the pump.  If there is water in the tubing when you want to pump: 1. Attach the tubing to the pump. 2. Turn on the pump to dry the tubing. 3. Turn off the pump when the tube is dry. Summary  Pumping can help you start making milk after your baby is born. It lets you keep making milk when you are away from your baby.  When you are away from your baby, pump for about 15 minutes every 2-3 hours. Pump both breasts at the same time, if you can. This information is not intended to replace advice given to you by your health care provider. Make sure you discuss any questions you have with your health care provider. Document Revised: 03/25/2020 Document Reviewed: 03/25/2020 Elsevier Patient Education  2021 [employer].

## 2020-11-19 NOTE — Addendum Note (Signed)
Addended by: Merian Capron on: 11/19/2020 09:41 AM   Modules accepted: Orders

## 2020-12-10 ENCOUNTER — Other Ambulatory Visit: Payer: Self-pay

## 2020-12-10 ENCOUNTER — Ambulatory Visit (INDEPENDENT_AMBULATORY_CARE_PROVIDER_SITE_OTHER): Admitting: Family Medicine

## 2020-12-10 VITALS — BP 109/77 | HR 80 | Wt 166.0 lb

## 2020-12-10 DIAGNOSIS — Z30431 Encounter for routine checking of intrauterine contraceptive device: Secondary | ICD-10-CM

## 2020-12-10 NOTE — Progress Notes (Signed)
   Subjective:   Patient Name: Angela Pollard, female   DOB: 21-Aug-1985, 35 y.o.  MRN: [id number]  HPI Patient here for an IUD check.  She had the Liletta IUD placed 1 month ago.  She reports occasional spotting.   Review of Systems  Constitutional: Negative for fever and chills.  Gastrointestinal: Negative for abdominal pain.  Genitourinary: Negative for vaginal discharge, vaginal pain, pelvic pain and dyspareunia.        Objective:   Physical Exam  Constitutional: She appears well-developed and well-nourished.  HENT:  Head: Normocephalic and atraumatic.  Abdominal: Soft. There is no tenderness. There is no guarding.  Genitourinary: There is no rash, tenderness or lesion on the right labia. There is no rash, tenderness or lesion on the left labia. No erythema or tenderness in the vagina. No foreign body around the vagina. No signs of injury around the vagina. No vaginal discharge found.    Skin: Skin is warm and dry.  Psychiatric: She has a normal mood and affect. Her behavior is normal. Judgment and thought content normal.       Assessment & Plan:  1. IUD check up IUD in place.  Pt to call with any other problems.  Recheck in 1 year.

## 2020-12-26 ENCOUNTER — Ambulatory Visit
Admission: RE | Admit: 2020-12-26 | Discharge: 2020-12-26 | Disposition: A | Discharge status: Home / Self Care | Payer: No Typology Code available for payment source | Source: Ambulatory Visit | Attending: Family Medicine | Admitting: Family Medicine

## 2020-12-26 ENCOUNTER — Other Ambulatory Visit: Payer: Self-pay

## 2020-12-26 DIAGNOSIS — N6310 Unspecified lump in the right breast, unspecified quadrant: Secondary | ICD-10-CM

## 2020-12-26 DIAGNOSIS — R922 Inconclusive mammogram: Secondary | ICD-10-CM | POA: Diagnosis not present

## 2020-12-26 DIAGNOSIS — N6311 Unspecified lump in the right breast, upper outer quadrant: Secondary | ICD-10-CM | POA: Diagnosis not present

## 2020-12-26 DIAGNOSIS — N6001 Solitary cyst of right breast: Secondary | ICD-10-CM | POA: Diagnosis not present

## 2021-05-07 ENCOUNTER — Other Ambulatory Visit (HOSPITAL_BASED_OUTPATIENT_CLINIC_OR_DEPARTMENT_OTHER): Payer: Self-pay

## 2021-05-07 MED ORDER — INFLUENZA VAC SPLIT QUAD 0.5 ML IM SUSY
PREFILLED_SYRINGE | INTRAMUSCULAR | 0 refills | Status: DC
  Filled 2021-05-07: qty 0.5, 1d supply, fill #0

## 2021-05-15 ENCOUNTER — Other Ambulatory Visit (HOSPITAL_BASED_OUTPATIENT_CLINIC_OR_DEPARTMENT_OTHER): Payer: Self-pay

## 2021-05-15 ENCOUNTER — Other Ambulatory Visit: Payer: Self-pay

## 2021-05-15 ENCOUNTER — Encounter: Payer: Self-pay | Admitting: Family

## 2021-05-15 ENCOUNTER — Ambulatory Visit (INDEPENDENT_AMBULATORY_CARE_PROVIDER_SITE_OTHER): Payer: BC Managed Care – PPO | Admitting: Family

## 2021-05-15 VITALS — BP 156/113 | HR 80 | Temp 97.1°F | Resp 16 | Ht 66.0 in | Wt 172.0 lb

## 2021-05-15 DIAGNOSIS — D649 Anemia, unspecified: Secondary | ICD-10-CM

## 2021-05-15 DIAGNOSIS — Z0000 Encounter for general adult medical examination without abnormal findings: Secondary | ICD-10-CM | POA: Insufficient documentation

## 2021-05-15 DIAGNOSIS — I10 Essential (primary) hypertension: Secondary | ICD-10-CM | POA: Diagnosis not present

## 2021-05-15 MED ORDER — AMLODIPINE BESYLATE 5 MG PO TABS
5.0000 mg | ORAL_TABLET | Freq: Every day | ORAL | 0 refills | Status: DC
Start: 2021-05-15 — End: 2021-08-31
  Filled 2021-05-15: qty 90, 90d supply, fill #0

## 2021-05-15 NOTE — Addendum Note (Signed)
Addended by: Rosita Kea on: 05/15/2021 02:23 PM   Modules accepted: Orders

## 2021-05-15 NOTE — Patient Instructions (Signed)
Please begin amlodipine 5mg once daily.   

## 2021-05-15 NOTE — Progress Notes (Signed)
Subjective:     Patient ID: Angela Pollard, female    DOB: [birth year], 35 y.o.   MRN: [id number]  No chief complaint on file.   HPI Patient is in today for physical.  Had a son back in April. Breastfeeding.    HTN- Notes that she has not been checking her blood pressure.   BP Readings from Last 3 Encounters:  05/15/21 (!) 156/113  12/10/20 109/77  11/19/20 (!) 146/86   Doing couples therapy.  Cons  Patient presents today for complete physical.  Immunizations: She had Covid Vaccines (2),  Will consider booster.   Diet: diet is fair  Exercise: 2 x a week walking Pap Smear: 2020 Vision:  due, will schedule Dental: due plans to schedule  Wt Readings from Last 3 Encounters:  05/15/21 172 lb (78 kg)  12/10/20 166 lb (75.3 kg)  11/19/20 170 lb (77.1 kg)     Health Maintenance Due  Topic Date Due   COVID-19 Vaccine (1) Never done    Past Medical History:  Diagnosis Date   Hypertension    x 4-5 years    Past Surgical History:  Procedure Laterality Date   CESAREAN SECTION  09/2020   NO PAST SURGERIES      Family History  Problem Relation Age of Onset   Brain cancer Mother 87   Cancer Mother 22       brain   Kidney failure Father    CVA Father    Hypertension Father    Diabetes Mellitus II Father    Hyperlipidemia Father    Diabetes Mellitus II Half-Sister    Heart failure Maternal Aunt    Diabetes Neg Hx     Social History   Socioeconomic History   Marital status: Divorced    Spouse name: Not on file   Number of children: 1   Years of education: Not on file   Highest education level: Bachelor's degree (e.g., BA, AB, BS)  Occupational History   Not on file  Tobacco Use   Smoking status: Former    Packs/day: 0.30    Years: 8.00    Pack years: 2.40    Types: Cigarettes   Smokeless tobacco: Never  Vaping Use   Vaping Use: Never used  Substance and Sexual Activity   Alcohol use: Not Currently    Comment: occasional   Drug use: Never    Sexual activity: Yes    Partners: Male    Birth control/protection: I.U.D.  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[employer], pt is a Marine scientist   Not married   Working on [employer] degree online   Has a son born 2008, son born 2022   Lives with significant other and son   Enjoys spending time with family, spending time with family, hiking   Social Determinants of Health   Financial Resource Strain: Not on file  Food Insecurity: Not on file  Transportation Needs: Not on file  Physical Activity: Not on file  Stress: Not on file  Social Connections: Not on file  Intimate Partner Violence: Not on file    Outpatient Medications Prior to Visit  Medication Sig Dispense Refill   acetaminophen (TYLENOL) 325 MG tablet Take 650 mg by mouth every 6 (six) hours as needed.     influenza vac split quadrivalent PF (FLUARIX) 0.5 ML injection Inject into the muscle. 0.5 mL 0   Lecithin 1200 MG CAPS Take  by mouth.     levonorgestrel (LILETTA, 52 MG,) 20.1 MCG/DAY IUD 1 each by Intrauterine route once.     Multiple Vitamin (MULTI-VITAMIN) tablet Take 1 tablet by mouth daily.     NIFEdipine (PROCARDIA XL/NIFEDICAL XL) 60 MG 24 hr tablet TAKE 1 TABLET (60 MG TOTAL) BY MOUTH DAILY. 90 tablet 1   No facility-administered medications prior to visit.    No Known Allergies  ROS See HPI    Objective:    Physical Exam  BP (!) 156/113 (BP Location: Right Arm, Patient Position: Sitting, Cuff Size: Small)   Pulse 80   Temp (!) 97.1 F (36.2 C) (Oral)   Resp 16   Ht $R'5\' 6"'ri$  (4.270 m)   Wt 172 lb (78 kg)   SpO2 98%   BMI 27.76 kg/m  Wt Readings from Last 3 Encounters:  05/15/21 172 lb (78 kg)  12/10/20 166 lb (75.3 kg)  11/19/20 170 lb (77.1 kg)   Physical Exam  Constitutional: She is oriented to person, place, and time. She appears well-developed and well-nourished. No distress.  HENT:  Head: Normocephalic and atraumatic.  Right Ear: Tympanic membrane and ear  canal normal.  Left Ear: Tympanic membrane and ear canal normal.  Mouth/Throat: not examined. Pt wearing mask Eyes: Pupils are equal, round, and reactive to light. No scleral icterus.  Neck: Normal range of motion. No thyromegaly present.  Cardiovascular: Normal rate and regular rhythm.   No murmur heard. Pulmonary/Chest: Effort normal and breath sounds normal. No respiratory distress. He has no wheezes. She has no rales. She exhibits no tenderness.  Abdominal: Soft. Bowel sounds are normal. She exhibits no distension and no mass. There is no tenderness. There is no rebound and no guarding.  Musculoskeletal: She exhibits no edema.  Lymphadenopathy:    She has no cervical adenopathy.  Neurological: She is alert and oriented to person, place, and time. She has normal patellar reflexes. She exhibits normal muscle tone. Coordination normal.  Skin: Skin is warm and dry.  Psychiatric: She has a normal mood and affect. Her behavior is normal. Judgment and thought content normal.  Breast/pelvic: deferred          Assessment & Plan:       Assessment & Plan:   Problem List Items Addressed This Visit       Unprioritized   Preventative health care    Discussed healthy diet, exercise and weight loss. Recommended bivalent covid booster.  Pap up to date .       Essential hypertension    Uncontrolled. Add amlodipine $RemoveBefore'5mg'[id number]$ .       Relevant Medications   amLODipine (NORVASC) 5 MG tablet   Other Visit Diagnoses     Anemia, unspecified type    -  Primary   Relevant Orders   CBC with Differential/Platelet   Primary hypertension       Relevant Medications   amLODipine (NORVASC) 5 MG tablet   Other Relevant Orders   Comp Met (CMET)       I have discontinued Fanta Secrist's NIFEdipine. I am also having her start on amLODipine. Additionally, I am having her maintain her acetaminophen, Multi-Vitamin, Lecithin, Liletta (52 MG), and influenza vac split quadrivalent PF.  Meds ordered  this encounter  Medications   amLODipine (NORVASC) 5 MG tablet    Sig: Take 1 tablet (5 mg total) by mouth daily.    Dispense:  90 tablet    Refill:  0    Order Specific Question:  Supervising Provider    Answer:   Penni Homans A [[id number]]

## 2021-05-15 NOTE — Assessment & Plan Note (Signed)
Uncontrolled. Add amlodipine 5mg.  

## 2021-05-15 NOTE — Assessment & Plan Note (Signed)
Discussed healthy diet, exercise and weight loss. Recommended bivalent covid booster.  Pap up to date .

## 2021-05-16 LAB — COMPREHENSIVE METABOLIC PANEL
AG Ratio: 1.3 (calc) (ref 1.0–2.5)
ALT: 9 U/L (ref 6–29)
AST: 13 U/L (ref 10–30)
Albumin: 4.2 g/dL (ref 3.6–5.1)
Alkaline phosphatase (APISO): 102 U/L (ref 31–125)
BUN: 14 mg/dL (ref 7–25)
CO2: 28 mmol/L (ref 20–32)
Calcium: 9.2 mg/dL (ref 8.6–10.2)
Chloride: 102 mmol/L (ref 98–110)
Creat: 0.72 mg/dL (ref 0.50–0.97)
Globulin: 3.2 g/dL (calc) (ref 1.9–3.7)
Glucose, Bld: 86 mg/dL (ref 65–99)
Potassium: 4.3 mmol/L (ref 3.5–5.3)
Sodium: 138 mmol/L (ref 135–146)
Total Bilirubin: 0.4 mg/dL (ref 0.2–1.2)
Total Protein: 7.4 g/dL (ref 6.1–8.1)

## 2021-05-16 LAB — CBC WITH DIFFERENTIAL/PLATELET
Absolute Monocytes: 538 cells/uL (ref 200–950)
Basophils Absolute: 67 cells/uL (ref 0–200)
Basophils Relative: 0.8 %
Eosinophils Absolute: 227 cells/uL (ref 15–500)
Eosinophils Relative: 2.7 %
HCT: 40.2 % (ref 35.0–45.0)
Hemoglobin: 12.9 g/dL (ref 11.7–15.5)
Lymphs Abs: 2545 cells/uL (ref 850–3900)
MCH: 26.4 pg — ABNORMAL LOW (ref 27.0–33.0)
MCHC: 32.1 g/dL (ref 32.0–36.0)
MCV: 82.2 fL (ref 80.0–100.0)
MPV: 10.4 fL (ref 7.5–12.5)
Monocytes Relative: 6.4 %
Neutro Abs: 5023 cells/uL (ref 1500–7800)
Neutrophils Relative %: 59.8 %
Platelets: 397 10*3/uL (ref 140–400)
RBC: 4.89 10*6/uL (ref 3.80–5.10)
RDW: 14.5 % (ref 11.0–15.0)
Total Lymphocyte: 30.3 %
WBC: 8.4 10*3/uL (ref 3.8–10.8)

## 2021-05-29 ENCOUNTER — Ambulatory Visit (INDEPENDENT_AMBULATORY_CARE_PROVIDER_SITE_OTHER): Payer: BC Managed Care – PPO | Admitting: Family

## 2021-05-29 DIAGNOSIS — I10 Essential (primary) hypertension: Secondary | ICD-10-CM | POA: Diagnosis not present

## 2021-05-29 NOTE — Progress Notes (Signed)
Subjective:   By signing my name below, I, Zite Okoli, attest that this documentation has been prepared under the direction and in the presence ofO'Sullivan, Efraim Kaufmann, NP  05/29/2021   Patient ID: Angela Pollard, female    DOB: 11-11-1985, 35 y.o.   MRN: [id number]  Chief Complaint  Patient presents with   Follow-up    2 week No new Concerns/ questions    HPI Patient is in today for an office visit and 2 week f/u.  Hypertension- She started 5 mg amlodipine after her last visit to manage her blood pressure because it was high. She takes it at night and is breastfeeding now. She does not check her blood pressure at home. Her blood pressure is a little high at this visit. BP Readings from Last 3 Encounters:  05/29/21 (!) 144/92  05/15/21 (!) 156/113  12/10/20 109/77     Past Medical History:  Diagnosis Date   Hypertension    x 4-5 years    Past Surgical History:  Procedure Laterality Date   CESAREAN SECTION  09/2020   NO PAST SURGERIES      Family History  Problem Relation Age of Onset   Brain cancer Mother 89   Cancer Mother 63       brain   Kidney failure Father    CVA Father    Hypertension Father    Diabetes Mellitus II Father    Hyperlipidemia Father    Diabetes Mellitus II Half-Sister    Heart failure Maternal Aunt    Diabetes Neg Hx     Social History   Socioeconomic History   Marital status: Divorced    Spouse name: Not on file   Number of children: 1   Years of education: Not on file   Highest education level: Bachelor's degree (e.g., BA, AB, BS)  Occupational History   Not on file  Tobacco Use   Smoking status: Former    Packs/day: 0.30    Years: 8.00    Pack years: 2.40    Types: Cigarettes   Smokeless tobacco: Never  Vaping Use   Vaping Use: Never used  Substance and Sexual Activity   Alcohol use: Not Currently    Comment: occasional   Drug use: Never   Sexual activity: Yes    Partners: Male    Birth control/protection: I.U.D.   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[employer], pt is a Engineer, civil (consulting)   Not married   Working on [employer] degree online   Has a son born 2008, son born 2022   Lives with significant other and son   Enjoys spending time with family, spending time with family, hiking   Social Determinants of Health   Financial Resource Strain: Not on file  Food Insecurity: Not on file  Transportation Needs: Not on file  Physical Activity: Not on file  Stress: Not on file  Social Connections: Not on file  Intimate Partner Violence: Not on file    Outpatient Medications Prior to Visit  Medication Sig Dispense Refill   acetaminophen (TYLENOL) 325 MG tablet Take 650 mg by mouth every 6 (six) hours as needed.     amLODipine (NORVASC) 5 MG tablet Take 1 tablet (5 mg total) by mouth daily. 90 tablet 0   influenza vac split quadrivalent PF (FLUARIX) 0.5 ML injection Inject into the muscle. 0.5 mL 0   Lecithin 1200 MG CAPS Take by mouth.  levonorgestrel (LILETTA, 52 MG,) 20.1 MCG/DAY IUD 1 each by Intrauterine route once.     Multiple Vitamin (MULTI-VITAMIN) tablet Take 1 tablet by mouth daily.     No facility-administered medications prior to visit.    No Known Allergies  Review of Systems  Constitutional:  Negative for fever.  HENT:  Negative for ear pain and hearing loss.        (-)nystagmus (-)adenopathy  Eyes:  Negative for blurred vision.  Respiratory:  Negative for cough, shortness of breath and wheezing.   Cardiovascular:  Negative for chest pain and leg swelling.  Gastrointestinal:  Negative for blood in stool, diarrhea, nausea and vomiting.  Genitourinary:  Negative for dysuria and frequency.  Musculoskeletal:  Negative for joint pain and myalgias.  Skin:  Negative for rash.  Neurological:  Negative for headaches.  Psychiatric/Behavioral:  Negative for depression. The patient is not nervous/anxious.       Objective:    Physical Exam Constitutional:       General: She is not in acute distress.    Appearance: Normal appearance. She is not ill-appearing.  HENT:     Head: Normocephalic and atraumatic.     Right Ear: External ear normal.     Left Ear: External ear normal.  Eyes:     Extraocular Movements: Extraocular movements intact.     Pupils: Pupils are equal, round, and reactive to light.  Cardiovascular:     Rate and Rhythm: Normal rate and regular rhythm.     Pulses: Normal pulses.     Heart sounds: Normal heart sounds. No murmur heard. Pulmonary:     Effort: Pulmonary effort is normal. No respiratory distress.     Breath sounds: Normal breath sounds. No wheezing or rhonchi.  Abdominal:     General: Bowel sounds are normal. There is no distension.     Palpations: Abdomen is soft.     Tenderness: There is no abdominal tenderness. There is no guarding or rebound.  Musculoskeletal:     Cervical back: Neck supple.  Lymphadenopathy:     Cervical: No cervical adenopathy.  Skin:    General: Skin is warm and dry.  Neurological:     Mental Status: She is alert and oriented to person, place, and time.  Psychiatric:        Behavior: Behavior normal.        Judgment: Judgment normal.    BP (!) 144/92 (BP Location: Right Arm, Patient Position: Sitting, Cuff Size: Normal)   Pulse 86   Resp 18   Ht 5\' 6"  (1.676 m)   Wt 170 lb 3.2 oz (77.2 kg)   SpO2 98%   BMI 27.47 kg/m  Wt Readings from Last 3 Encounters:  05/29/21 170 lb 3.2 oz (77.2 kg)  05/15/21 172 lb (78 kg)  12/10/20 166 lb (75.3 kg)      Assessment & Plan:   Problem List Items Addressed This Visit       Unprioritized   Essential hypertension    BP Readings from Last 3 Encounters:  05/29/21 (!) 144/92  05/15/21 (!) 156/113  12/10/20 109/77  BP is much improved but reading is still a little high today. I have asked her to check her blood pressure once daily for 1 week and then send me her readings via mychart. Continue amlodipine 5mg  once daily and if BP  remains >140/90 via home readings, will plan to increase amlodipine dose.        No orders of the defined  types were placed in this encounter.   I,Zite Okoli,acting as a Neurosurgeon for [employer], NP.,have documented all relevant documentation on the behalf of Lemont Fillers, NP,as directed by  Lemont Fillers, NP while in the presence of Lemont Fillers, NP.   I, Sandford Craze, NP , personally preformed the services described in this documentation.  All medical record entries made by the scribe were at my direction and in my presence.  I have reviewed the chart and discharge instructions (if applicable) and agree that the record reflects my personal performance and is accurate and complete. 05/29/2021

## 2021-05-29 NOTE — Patient Instructions (Signed)
Please

## 2021-05-29 NOTE — Assessment & Plan Note (Addendum)
BP Readings from Last 3 Encounters:  05/29/21 (!) 144/92  05/15/21 (!) 156/113  12/10/20 109/77   BP is much improved but reading is still a little high today. I have asked her to check her blood pressure once daily for 1 week and then send me her readings via mychart. Continue amlodipine 5mg  once daily and if BP remains >140/90 via home readings, will plan to increase amlodipine dose.

## 2021-08-31 ENCOUNTER — Other Ambulatory Visit: Payer: Self-pay | Admitting: Family

## 2021-08-31 ENCOUNTER — Other Ambulatory Visit (HOSPITAL_BASED_OUTPATIENT_CLINIC_OR_DEPARTMENT_OTHER): Payer: Self-pay

## 2021-08-31 MED ORDER — AMLODIPINE BESYLATE 5 MG PO TABS
5.0000 mg | ORAL_TABLET | Freq: Every day | ORAL | 0 refills | Status: DC
  Filled 2021-08-31: qty 30, 30d supply, fill #0

## 2021-09-28 ENCOUNTER — Other Ambulatory Visit (HOSPITAL_BASED_OUTPATIENT_CLINIC_OR_DEPARTMENT_OTHER): Payer: Self-pay

## 2021-09-28 ENCOUNTER — Ambulatory Visit: Payer: BC Managed Care – PPO | Admitting: Family Medicine

## 2021-09-28 ENCOUNTER — Encounter: Payer: Self-pay | Admitting: Family Medicine

## 2021-09-28 VITALS — BP 110/90 | HR 101 | Temp 98.9°F | Resp 16 | Ht 66.0 in | Wt 176.4 lb

## 2021-09-28 DIAGNOSIS — J029 Acute pharyngitis, unspecified: Secondary | ICD-10-CM | POA: Diagnosis not present

## 2021-09-28 MED ORDER — AMOXICILLIN 875 MG PO TABS
875.0000 mg | ORAL_TABLET | Freq: Two times a day (BID) | ORAL | 0 refills | Status: AC
  Filled 2021-09-28: qty 20, 10d supply, fill #0

## 2021-09-28 NOTE — Progress Notes (Signed)
? ?Subjective:  ? ?By signing my name below, I, Cassell Clement, attest that this documentation has been prepared under the direction and in the presence of Seabron Spates DO, 09/28/2021   ? ? Patient ID: Angela Pollard, female    DOB: 30-Dec-1985, 36 y.o.   MRN: [id number] ? ?No chief complaint on file. ? ? ?HPI ?Patient is in today for an office visit. ? ?She complains of a cough, sore throat, and runny nose that begun 09/22/2021. Symptoms worsen during the night. She takes cough drops to help alleviate symptoms. She denies having a fever.  She notes that her son was sick recently and was diagnosed with inflamed thyroid glands. He was prescribed antibiotics. ? ?She is inquiring about a tumeric and ginger supplement. She is currently breastfeeding her son and wants to know if it would affect the feeding.  ? ?Past Medical History:  ?Diagnosis Date  ?? Hypertension   ? x 4-5 years  ? ? ?Past Surgical History:  ?Procedure Laterality Date  ?? CESAREAN SECTION  09/2020  ?? NO PAST SURGERIES    ? ? ?Family History  ?Problem Relation Age of Onset  ?? Brain cancer Mother 94  ?? Cancer Mother 43  ?     brain  ?? Kidney failure Father   ?? CVA Father   ?? Hypertension Father   ?? Diabetes Mellitus II Father   ?? Hyperlipidemia Father   ?? Diabetes Mellitus II Half-Sister   ?? Heart failure Maternal Aunt   ?? Diabetes Neg Hx   ? ? ?Social History  ? ?Socioeconomic History  ?? Marital status: Divorced  ?  Spouse name: Not on file  ?? Number of children: 1  ?? Years of education: Not on file  ?? Highest education level: Bachelor's degree (e.g., BA, AB, BS)  ?Occupational History  ?? Not on file  ?Tobacco Use  ?? Smoking status: Former  ?  Packs/day: 0.30  ?  Years: 8.00  ?  Pack years: 2.40  ?  Types: Cigarettes  ?? Smokeless tobacco: Never  ?Vaping Use  ?? Vaping Use: Never used  ?Substance and Sexual Activity  ?? Alcohol use: Not Currently  ?  Comment: occasional  ?? Drug use: Never  ?? Sexual activity: Yes  ?  Partners:  Male  ?  Birth control/protection: I.U.D.  ?Other Topics Concern  ?? Not on file  ?Social History Narrative  ? 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 Not married  ? Working on [employer] degree online  ? Has a son born 2008, son born 2022  ? Lives with significant other and son  ? Enjoys spending time with family, spending time with family, hiking  ? ?Social Determinants of Health  ? ?Financial Resource Strain: Not on file  ?Food Insecurity: Not on file  ?Transportation Needs: Not on file  ?Physical Activity: Not on file  ?Stress: Not on file  ?Social Connections: Not on file  ?Intimate Partner Violence: Not on file  ? ? ?Outpatient Medications Prior to Visit  ?Medication Sig Dispense Refill  ?? acetaminophen (TYLENOL) 325 MG tablet Take 650 mg by mouth every 6 (six) hours as needed.    ?? amLODipine (NORVASC) 5 MG tablet Take 1 tablet (5 mg total) by mouth daily. Please make appointment for further refills. 30 tablet 0  ?? influenza vac split quadrivalent PF (FLUARIX) 0.5 ML injection Inject into the muscle. 0.5 mL 0  ?? Lecithin 1200 MG CAPS Take by mouth.    ??  levonorgestrel (LILETTA, 52 MG,) 20.1 MCG/DAY IUD 1 each by Intrauterine route once.    ?? Multiple Vitamin (MULTI-VITAMIN) tablet Take 1 tablet by mouth daily.    ? ?No facility-administered medications prior to visit.  ? ? ?No Known Allergies ? ?Review of Systems  ?Constitutional:  Negative for fever and malaise/fatigue.  ?HENT:  Positive for sore throat. Negative for congestion.   ?     (+) Runny nose  ?Eyes:  Negative for blurred vision.  ?Respiratory:  Positive for cough. Negative for shortness of breath.   ?Cardiovascular:  Negative for chest pain, palpitations and leg swelling.  ?Gastrointestinal:  Negative for abdominal pain, blood in stool and nausea.  ?Genitourinary:  Negative for dysuria and frequency.  ?Musculoskeletal:  Negative for falls.  ?Skin:  Negative for rash.  ?Neurological:  Negative for dizziness, loss of consciousness and headaches.   ?Endo/Heme/Allergies:  Negative for environmental allergies.  ?Psychiatric/Behavioral:  Negative for depression. The patient is not nervous/anxious.   ? ?   ?Objective:  ?  ?Physical Exam ?Vitals and nursing note reviewed.  ?Constitutional:   ?   General: She is not in acute distress. ?   Appearance: Normal appearance. She is well-developed. She is not ill-appearing.  ?HENT:  ?   Head: Normocephalic and atraumatic.  ?   Right Ear: External ear normal.  ?   Left Ear: External ear normal.  ?Eyes:  ?   Extraocular Movements: Extraocular movements intact.  ?   Conjunctiva/sclera: Conjunctivae normal.  ?   Pupils: Pupils are equal, round, and reactive to light.  ?Neck:  ?   Thyroid: No thyromegaly.  ?   Vascular: No carotid bruit or JVD.  ?Cardiovascular:  ?   Rate and Rhythm: Normal rate and regular rhythm.  ?   Heart sounds: Normal heart sounds. No murmur heard. ?  No gallop.  ?Pulmonary:  ?   Effort: Pulmonary effort is normal. No respiratory distress.  ?   Breath sounds: Normal breath sounds. No wheezing or rales.  ?Chest:  ?   Chest wall: No tenderness.  ?Musculoskeletal:  ?   Cervical back: Normal range of motion and neck supple.  ?Skin: ?   General: Skin is warm and dry.  ?Neurological:  ?   Mental Status: She is alert and oriented to person, place, and time.  ?Psychiatric:     ?   Judgment: Judgment normal.  ? ? ?There were no vitals taken for this visit. ?Wt Readings from Last 3 Encounters:  ?05/29/21 170 lb 3.2 oz (77.2 kg)  ?05/15/21 172 lb (78 kg)  ?12/10/20 166 lb (75.3 kg)  ? ? ?Diabetic Foot Exam - Simple   ?No data filed ?  ? ?Lab Results  ?Component Value Date  ? WBC 8.4 05/15/2021  ? HGB 12.9 05/15/2021  ? HCT 40.2 05/15/2021  ? PLT 397 05/15/2021  ? GLUCOSE 86 05/15/2021  ? CHOL 197 01/02/2019  ? TRIG 54.0 01/02/2019  ? HDL 60.20 01/02/2019  ? LDLCALC 126 (H) 01/02/2019  ? ALT 9 05/15/2021  ? AST 13 05/15/2021  ? NA 138 05/15/2021  ? K 4.3 05/15/2021  ? CL 102 05/15/2021  ? CREATININE 0.72 05/15/2021   ? BUN 14 05/15/2021  ? CO2 28 05/15/2021  ? TSH 1.62 01/02/2019  ? ? ?Lab Results  ?Component Value Date  ? TSH 1.62 01/02/2019  ? ?Lab Results  ?Component Value Date  ? WBC 8.4 05/15/2021  ? HGB 12.9 05/15/2021  ? HCT 40.2 05/15/2021  ?  MCV 82.2 05/15/2021  ? PLT 397 05/15/2021  ? ?Lab Results  ?Component Value Date  ? NA 138 05/15/2021  ? K 4.3 05/15/2021  ? CO2 28 05/15/2021  ? GLUCOSE 86 05/15/2021  ? BUN 14 05/15/2021  ? CREATININE 0.72 05/15/2021  ? BILITOT 0.4 05/15/2021  ? ALKPHOS 71 04/03/2020  ? AST 13 05/15/2021  ? ALT 9 05/15/2021  ? PROT 7.4 05/15/2021  ? ALBUMIN 4.3 04/03/2020  ? CALCIUM 9.2 05/15/2021  ? ANIONGAP 7 12/27/2019  ? GFR 88.65 01/02/2019  ? ?Lab Results  ?Component Value Date  ? CHOL 197 01/02/2019  ? ?Lab Results  ?Component Value Date  ? HDL 60.20 01/02/2019  ? ?Lab Results  ?Component Value Date  ? LDLCALC 126 (H) 01/02/2019  ? ?Lab Results  ?Component Value Date  ? TRIG 54.0 01/02/2019  ? ?Lab Results  ?Component Value Date  ? CHOLHDL 3 01/02/2019  ? ?No results found for: HGBA1C ? ?   ?Assessment & Plan:  ? ?Problem List Items Addressed This Visit   ?None ? ? ? ? ?No orders of the defined types were placed in this encounter. ? ? ?I, Cassell Clement, personally preformed the services described in this documentation.  All medical record entries made by the scribe were at my direction and in my presence.  I have reviewed the chart and discharge instructions (if applicable) and agree that the record reflects my personal performance and is accurate and complete. 09/28/2021 ? ? ?I,Amber Collins,acting as a Neurosurgeon for [employer], DO.,have documented all relevant documentation on the behalf of Donato Schultz, DO,as directed by  Donato Schultz, DO while in the presence of Donato Schultz, DO. ? ? ? ?[employer] ? ?

## 2021-09-28 NOTE — Patient Instructions (Signed)
Pharyngitis °Pharyngitis is inflammation of the throat (pharynx). It is a very common cause of sore throat. Pharyngitis can be caused by a bacteria, but it is usually caused by a virus. Most cases of pharyngitis get better on their own without treatment. °What are the causes? °This condition may be caused by: °Infection by viruses (viral). Viral pharyngitis spreads easily from person to person (is contagious) through coughing, sneezing, and sharing of personal items or utensils such as cups, forks, spoons, and toothbrushes. °Infection by bacteria (bacterial). Bacterial pharyngitis may be spread by touching the nose or face after coming in contact with the bacteria, or through close contact, such as kissing. °Allergies. Allergies can cause buildup of mucus in the throat (post-nasal drip), leading to inflammation and irritation. Allergies can also cause blocked nasal passages, forcing breathing through the mouth, which dries and irritates the throat. °What increases the risk? °You are more likely to develop this condition if: °You are 5-24 years old. °You are exposed to crowded environments such as daycare, school, or dormitory living. °You live in a cold climate. °You have a weakened disease-fighting (immune) system. °What are the signs or symptoms? °Symptoms of this condition vary by the cause. Common symptoms of this condition include: °Sore throat. °Fatigue. °Low-grade fever. °Stuffy nose (nasal congestion) and cough. °Headache. °Other symptoms may include: °Glands in the neck (lymph nodes) that are swollen. °Skin rashes. °Plaque-like film on the throat or tonsils. This is often a symptom of bacterial pharyngitis. °Vomiting. °Red, itchy eyes (conjunctivitis). °Loss of appetite. °Joint pain and muscle aches. °Enlarged tonsils. °How is this diagnosed? °This condition may be diagnosed based on your medical history and a physical exam. Your health care provider will ask you questions about your illness and your  symptoms. °A swab of your throat may be done to check for bacteria (rapid strep test). Other lab tests may also be done, depending on the suspected cause, but these are rare. °How is this treated? °Many times, treatment is not needed for this condition. Pharyngitis usually gets better in 3-4 days without treatment. °Bacterial pharyngitis may be treated with antibiotic medicines. °Follow these instructions at home: °Medicines °Take over-the-counter and prescription medicines only as told by your health care provider. °If you were prescribed an antibiotic medicine, take it as told by your health care provider. Do not stop taking the antibiotic even if you start to feel better. °Use throat sprays to soothe your throat as told by your health care provider. °Children can get pharyngitis. Do not give your child aspirin because of the association with Reye's syndrome. °Managing pain °To help with pain, try: °Sipping warm liquids, such as broth, herbal tea, or warm water. °Eating or drinking cold or frozen liquids, such as frozen ice pops. °Gargling with a mixture of salt and water 3-4 times a day or as needed. To make salt water, completely dissolve ½-1 tsp (3-6 g) of salt in 1 cup (237 mL) of warm water. °Sucking on hard candy or throat lozenges. °Putting a cool-mist humidifier in your bedroom at night to moisten the air. °Sitting in the bathroom with the door closed for 5-10 minutes while you run hot water in the shower. ° °General instructions ° °Do not use any products that contain nicotine or tobacco. These products include cigarettes, chewing tobacco, and vaping devices, such as e-cigarettes. If you need help quitting, ask your health care provider. °Rest as told by your health care provider. °Drink enough fluid to keep your urine pale yellow. °How   is this prevented? °To help prevent becoming infected or spreading infection: °Wash your hands often with soap and water for at least 20 seconds. If soap and water are not  available, use hand sanitizer. °Do not touch your eyes, nose, or mouth with unwashed hands, and wash hands after touching these areas. °Do not share cups or eating utensils. °Avoid close contact with people who are sick. °Contact a health care provider if: °You have large, tender lumps in your neck. °You have a rash. °You cough up green, yellow-brown, or bloody mucus. °Get help right away if: °Your neck becomes stiff. °You drool or are unable to swallow liquids. °You cannot drink or take medicines without vomiting. °You have severe pain that does not go away, even after you take medicine. °You have trouble breathing, and it is not caused by a stuffy nose. °You have new pain and swelling in your joints such as the knees, ankles, wrists, or elbows. °These symptoms may represent a serious problem that is an emergency. Do not wait to see if the symptoms will go away. Get medical help right away. Call your local emergency services (911 in the [place] Do not drive yourself to the hospital. °Summary °Pharyngitis is redness, pain, and swelling (inflammation) of the throat (pharynx). °While pharyngitis can be caused by a bacteria, the most common causes are viral. °Most cases of pharyngitis get better on their own without treatment. °Bacterial pharyngitis is treated with antibiotic medicines. °This information is not intended to replace advice given to you by your health care provider. Make sure you discuss any questions you have with your health care provider. °Document Revised: 09/10/2020 Document Reviewed: 09/10/2020 °Elsevier Patient Education © 2022 [employer] ° °

## 2021-10-01 ENCOUNTER — Encounter: Payer: Self-pay | Admitting: General Practice

## 2021-10-29 ENCOUNTER — Other Ambulatory Visit (HOSPITAL_BASED_OUTPATIENT_CLINIC_OR_DEPARTMENT_OTHER): Payer: Self-pay

## 2021-10-29 ENCOUNTER — Ambulatory Visit: Payer: BC Managed Care – PPO | Admitting: Family Medicine

## 2021-10-29 VITALS — BP 146/80 | HR 90 | Temp 98.5°F | Resp 18 | Ht 66.0 in | Wt 179.8 lb

## 2021-10-29 DIAGNOSIS — J340 Abscess, furuncle and carbuncle of nose: Secondary | ICD-10-CM

## 2021-10-29 MED ORDER — AMOXICILLIN-POT CLAVULANATE 875-125 MG PO TABS
1.0000 | ORAL_TABLET | Freq: Two times a day (BID) | ORAL | 0 refills | Status: DC
Start: 2021-10-29 — End: 2021-12-10
  Filled 2021-10-29: qty 14, 7d supply, fill #0

## 2021-10-29 NOTE — Patient Instructions (Signed)
It was good to see you today- we will treat you for a soft tissue infection of the nose with augmentin for one week ?Please alert me if not getting better in the next couple of days- Sooner if worse.  ? ? ?

## 2021-10-29 NOTE — Progress Notes (Signed)
[facility] conservation [facility] at [employer] ?[address], [address] ?[place], [place] [address] ?336 [phone] ?Fax [phone] ? ?Date:  10/29/2021  ? ?Name:  Angela Pollard   DOB:  [birth year]   MRN:  [id number] ? ?PCP:  Sandford Craze, NP  ? ? ?Chief Complaint: Nose pain (Pt c/o: a swollen nose and says it is painful to the touch x Monday.) ? ? ?History of Present Illness: ? ?Angela Pollard is a 35 y.o. very pleasant female patient who presents with the following: ? ?Pt seen today with concern of her nose hurting- otherwise generally in good health  ?Pt of Angela Pollard  ? ?She has notes some redness and tenderness of her nose- she has noted it for about 4 days ?It seems swollen especially towards the tip of her nose ?She first noted redness about 4 days ago, swelling developed the next day ?She took her nose ring out -she has had it for a long time so she thinks likely not related ?She tried some triple antibiotic in her nose but it did not help ? ?Her nose is very tender to touch ?She generally feels well, no fever  ?History of hypertension, takes amlodipine ? ?Patient Active Problem List  ? Diagnosis Date Noted  ? Preventative health care 05/15/2021  ? Breast mass, right 11/19/2020  ? Infertility, female 02/20/2020  ? Essential hypertension 01/02/2019  ? ? ?Past Medical History:  ?Diagnosis Date  ? Hypertension   ? x 4-5 years  ? ? ?Past Surgical History:  ?Procedure Laterality Date  ? CESAREAN SECTION  09/2020  ? NO PAST SURGERIES    ? ? ?Social History  ? ?Tobacco Use  ? Smoking status: Former  ?  Packs/day: 0.30  ?  Years: 8.00  ?  Pack years: 2.40  ?  Types: Cigarettes  ? Smokeless tobacco: Never  ?Vaping Use  ? Vaping Use: Never used  ?Substance Use Topics  ? Alcohol use: Not Currently  ?  Comment: occasional  ? Drug use: Never  ? ? ?Family History  ?Problem Relation Age of Onset  ? Brain cancer Mother 64  ? Cancer Mother 35  ?     brain  ? Kidney failure Father   ? CVA Father   ?  Hypertension Father   ? Diabetes Mellitus II Father   ? Hyperlipidemia Father   ? Diabetes Mellitus II Half-Sister   ? Heart failure Maternal Aunt   ? Diabetes Neg Hx   ? ? ?No Known Allergies ? ?Medication list has been reviewed and updated. ? ?Current Outpatient Medications on File Prior to Visit  ?Medication Sig Dispense Refill  ? acetaminophen (TYLENOL) 325 MG tablet Take 650 mg by mouth every 6 (six) hours as needed.    ? amLODipine (NORVASC) 5 MG tablet Take 1 tablet (5 mg total) by mouth daily. Please make appointment for further refills. (Patient taking differently: Take 10 mg by mouth daily.) 30 tablet 0  ? Lecithin 1200 MG CAPS Take by mouth.    ? levonorgestrel (LILETTA, 52 MG,) 20.1 MCG/DAY IUD 1 each by Intrauterine route once.    ? Multiple Vitamin (MULTI-VITAMIN) tablet Take 1 tablet by mouth daily.    ? ?No current facility-administered medications on file prior to visit.  ? ? ?Review of Systems: ? ?As per HPI- otherwise negative. ? ? ?Physical Examination: ?Vitals:  ? 10/29/21 1510  ?BP: (!) 146/80  ?Pulse: 90  ?Resp: 18  ?Temp: 98.5 ?F (36.9 ?C)  ?  SpO2: 97%  ? ?Vitals:  ? 10/29/21 1510  ?Weight: 179 lb 12.8 oz (81.6 kg)  ?Height: 5\' 6"  (1.676 m)  ? ?Body mass index is 29.02 kg/m?. ?Ideal Body Weight: Weight in (lb) to have BMI = 25: 154.6 ? ?GEN: no acute distress.  Overweight, looks well ?HEENT: Atraumatic, Normocephalic. Bilateral TM wnl, oropharynx normal.  PEERL,EOMI.   ?Ears and Nose: No external deformity. ?CV: RRR, No M/G/R. No JVD. No thrill. No extra heart sounds. ?PULM: CTA B, no wheezes, crackles, rhonchi. No retractions. No resp. distress. No accessory muscle use. ?EXTR: No c/c/e ?PSYCH: Normally interactive. Conversant.  ?The distal portion of patient's nose is mildly inflamed and minimally swollen.  It is tender to touch.  Her right nostril piercing does not appear to be infected ?Nasal cavity is normal, a bit more inflamed on the right side ? ?Assessment and Plan: ?Cellulitis of  external nose - Plan: amoxicillin-clavulanate (AUGMENTIN) 875-125 MG tablet ?Patient seen today with likely cellulitis of her nose.  It has been present for about 4 days and is tender, we will start her on antibiotics.  Prescribed Augmentin for 7 days.  I asked her to please let me know if she is not feeling better in the next couple of days, sooner if getting worse ? ?Signed ? , MD ? ?

## 2021-12-08 ENCOUNTER — Telehealth: Payer: Self-pay

## 2021-12-08 NOTE — Telephone Encounter (Signed)
Nurse Assessment Nurse: Egbert Garibaldi, RN, Stephan Minister Date/Time Angela Pollard Time): 12/07/2021 8:34:43 PM Confirm and document reason for call. If symptomatic, describe symptoms. ---Caller c/o eye drainage. Started around noon and is getting worse in both eyes Does the patient have any new or worsening symptoms? ---Yes Will a triage be completed? ---Yes Related visit to physician within the last 2 weeks? ---No Does the PT have any chronic conditions? (i.e. diabetes, asthma, this includes High risk factors for pregnancy, etc.) ---Yes List chronic conditions. ---HTN Is the patient pregnant or possibly pregnant? (Ask all females between the ages of 39-55) ---No Is this a behavioral health or substance abuse call? ---No Nurse: Egbert Garibaldi, RN, Stephan Minister Date/Time (Eastern Time): 12/07/2021 8:51:31 PM Please select the assessment type ---Standing order Additional Documentation ---Caller c/o eye drainage. Other current medications? ---Unknown Medication allergies? ---No Pharmacy name and phone number. ---[facility] in [place] on [address] @ [phone] PLEASE NOTE: All timestamps contained within this report are represented as [place] Standard Time. CONFIDENTIALTY NOTICE: This fax transmission is intended only for the addressee. It contains information that is legally privileged, confidential or otherwise protected from use or disclosure. If you are not the intended recipient, you are strictly prohibited from reviewing, disclosing, copying using or disseminating any of this information or taking any action in reliance on or regarding this information. If you have received this fax in error, please notify us immediately by telephone so that we can arrange for its return to [place]. Phone: [phone], Toll-Free: [phone], Fax: [phone] Page: 2 of 3 Call Id: [id number] Guidelines Guideline Title Affirmed Question Affirmed Notes Nurse Date/Time Angela Pollard Time) Eye - Pus or Discharge [1] Eye with yellow or  green discharge, or eyelashes stick together AND [2] PCP standing order to call in antibiotic eye drops Egbert Garibaldi, RN, [facility] 12/07/2021 8:36:00 PM Disp. Time Angela Pollard Time) Disposition Final User 12/07/2021 8:53:09 PM Pharmacy Call Egbert Garibaldi, RN, Stephan Minister Reason: Prescription for eye drops 12/07/2021 8:51:22 PM Home Care Yes Egbert Garibaldi, RN, Stephan Minister Caller Disagree/Comply Comply Caller Understands Yes PreDisposition InappropriateToAsk Care Advice Given Per Guideline HOME CARE: * You should be able to treat this at home. REASSURANCE AND EDUCATION - PROBABLE BACTERIAL CONJUNCTIVITIS: * Conjunctivitis (pinkeye) caused by bacteria is a common complication of a cold. You can also get it from exposure to a child or adult who has had it recently. It is not harmful to your vision. EYELID CLEANSING: * Gently wash eyelids and lashes with warm water and wet cotton balls (or cotton gauze). Remove all the dried and liquid pus. * Do this as often as needed. * Prescription: Polytrim (polymyxin-trimethoprim) eyedrops, 5 ml bottle ($12) * Dosing: 1 to 2 drops four times daily for 5 to 7 days. Note: drop covers the adult eye. CONTAGIOUSNESS: * Pinkeye is contagious. Try not to touch your eyes. Wash your hands frequently. Do not share towels. * You may return to work or school. Avoid physical contact (e.g. shaking hands) until the symptoms have resolved. EXPECTED COURSE: * With treatment, the yellow discharge should clear up in 3 days. * The red eyes may persist for several more days. CALL BACK IF: * Pus lasts over 3 days (72 hours) on treatment * Light bothers your eyes * Blurred vision occurs * More than just mild eye discomfort * You become worse CARE ADVICE given per Eye - Pus or Discharge (Adult) guideline. Standing Orders Preparation Additional Instructions Route FrequencyDuration Nurse Comments User Name Polytrim Eye Drops 2 drops both eyes Eye Four Times Daily 5 Days No refills  Egbert Garibaldi, RN, Stephan Minister

## 2021-12-10 ENCOUNTER — Other Ambulatory Visit (HOSPITAL_BASED_OUTPATIENT_CLINIC_OR_DEPARTMENT_OTHER): Payer: Self-pay

## 2021-12-10 ENCOUNTER — Ambulatory Visit: Payer: BC Managed Care – PPO | Admitting: Family Medicine

## 2021-12-10 ENCOUNTER — Encounter: Payer: Self-pay | Admitting: Family Medicine

## 2021-12-10 VITALS — BP 141/105 | HR 82 | Temp 97.9°F | Ht 66.0 in | Wt 176.8 lb

## 2021-12-10 DIAGNOSIS — I10 Essential (primary) hypertension: Secondary | ICD-10-CM | POA: Diagnosis not present

## 2021-12-10 DIAGNOSIS — Z021 Encounter for pre-employment examination: Secondary | ICD-10-CM | POA: Diagnosis not present

## 2021-12-10 LAB — CBC
HCT: 39 % (ref 36.0–46.0)
Hemoglobin: 12.7 g/dL (ref 12.0–15.0)
MCHC: 32.5 g/dL (ref 30.0–36.0)
MCV: 82.3 fl (ref 78.0–100.0)
Platelets: 402 10*3/uL — ABNORMAL HIGH (ref 150.0–400.0)
RBC: 4.74 Mil/uL (ref 3.87–5.11)
RDW: 14 % (ref 11.5–15.5)
WBC: 8.8 10*3/uL (ref 4.0–10.5)

## 2021-12-10 LAB — COMPREHENSIVE METABOLIC PANEL
ALT: 10 U/L (ref 0–35)
AST: 12 U/L (ref 0–37)
Albumin: 4.1 g/dL (ref 3.5–5.2)
Alkaline Phosphatase: 90 U/L (ref 39–117)
BUN: 13 mg/dL (ref 6–23)
CO2: 30 mEq/L (ref 19–32)
Calcium: 9.3 mg/dL (ref 8.4–10.5)
Chloride: 100 mEq/L (ref 96–112)
Creatinine, Ser: 0.66 mg/dL (ref 0.40–1.20)
GFR: 112.73 mL/min (ref >60.00)
Glucose, Bld: 91 mg/dL (ref 70–99)
Potassium: 4.6 mEq/L (ref 3.5–5.1)
Sodium: 137 mEq/L (ref 135–145)
Total Bilirubin: 0.6 mg/dL (ref 0.2–1.2)
Total Protein: 7.3 g/dL (ref 6.0–8.3)

## 2021-12-10 MED ORDER — AMLODIPINE BESYLATE 10 MG PO TABS
10.0000 mg | ORAL_TABLET | Freq: Every day | ORAL | 1 refills | Status: DC
  Filled 2021-12-10: qty 90, 90d supply, fill #0
  Filled 2022-03-29: qty 90, 90d supply, fill #1

## 2021-12-10 NOTE — Assessment & Plan Note (Signed)
Blood pressure is not at goal for age and co-morbidities.  I recommend taking your amlodipine 10 mg daily as ordered, monitor at home, and f/u with PCP in 2 weeks.   - BP goal <130/80 - monitor and log blood pressures at home - check around the same time each day in a relaxed setting - Limit salt to <2000 mg/day - Follow DASH eating plan (heart healthy diet) - limit alcohol to 2 standard drinks per day for men and 1 per day for women - avoid tobacco products - get at least 2 hours of regular aerobic exercise weekly Patient aware of signs/symptoms requiring further/urgent evaluation. Labs updated today.

## 2021-12-10 NOTE — Patient Instructions (Signed)
Blood pressure is not at goal for age and co-morbidities.  I recommend taking your amlodipine 10 mg daily as ordered, monitor at home, and f/u with PCP in 2 weeks.   - BP goal <130/80 - monitor and log blood pressures at home - check around the same time each day in a relaxed setting - Limit salt to <2000 mg/day - Follow DASH eating plan (heart healthy diet) - limit alcohol to 2 standard drinks per day for men and 1 per day for women - avoid tobacco products - get at least 2 hours of regular aerobic exercise weekly Patient aware of signs/symptoms requiring further/urgent evaluation. Labs updated today. 

## 2021-12-10 NOTE — Progress Notes (Signed)
Established Patient Office Visit  Subjective   Patient ID: Angela Pollard, female    DOB: [birth year]  Age: 36 y.o. MRN: [id number]  CC: school forms   HPI  Patient reports she is doing well overall. 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 Denies any symptoms. Admits to not being very consistent with her amlodipine, but is motivated to be compliant.        Review of Systems  Constitutional: Negative.   HENT: Negative.    Eyes: Negative.   Respiratory: Negative.    Cardiovascular: Negative.   Gastrointestinal: Negative.   Genitourinary: Negative.   Musculoskeletal: Negative.   Skin: Negative.   Neurological: Negative.   Endo/Heme/Allergies: Negative.   Psychiatric/Behavioral: Negative.        Objective:     BP (!) 141/105   Pulse 82   Temp 97.9 F (36.6 C)   Ht 5\' 6"  (1.676 m)   Wt 176 lb 12.8 oz (80.2 kg)   BMI 28.54 kg/m    Physical Exam Vitals reviewed.  Constitutional:      Appearance: Normal appearance.  HENT:     Head: Normocephalic and atraumatic.     Right Ear: Tympanic membrane normal.     Left Ear: Tympanic membrane normal.     Nose: Nose normal.     Mouth/Throat:     Mouth: Mucous membranes are moist.     Pharynx: Oropharynx is clear.  Eyes:     Extraocular Movements: Extraocular movements intact.     Pupils: Pupils are equal, round, and reactive to light.  Cardiovascular:     Rate and Rhythm: Normal rate and regular rhythm.     Pulses: Normal pulses.     Heart sounds: Normal heart sounds.  Pulmonary:     Effort: Pulmonary effort is normal.     Breath sounds: Normal breath sounds.  Abdominal:     General: Abdomen is flat. Bowel sounds are normal.     Palpations: Abdomen is soft.  Musculoskeletal:        General: No swelling or tenderness. Normal range of motion.     Cervical back: Normal range of motion and neck supple. No tenderness.     Right lower leg: No edema.     Left  lower leg: No edema.  Lymphadenopathy:     Cervical: No cervical adenopathy.  Skin:    General: Skin is warm and dry.  Neurological:     General: No focal deficit present.     Mental Status: She is alert and oriented to person, place, and time. Mental status is at baseline.     Motor: No weakness.  Psychiatric:        Mood and Affect: Mood normal.        Behavior: Behavior normal.        Thought Content: Thought content normal.        Judgment: Judgment normal.      No results found for any visits on 12/10/21.    The ASCVD Risk score (Arnett DK, et al., 2019) failed to calculate for the following reasons:   The 2019 ASCVD risk score is only valid for ages 9 to 34    Assessment & Plan:   Problem List Items Addressed This Visit       Cardiovascular and Mediastinum   Essential hypertension - Primary    Blood pressure is not at goal for age and co-morbidities.  I recommend taking your amlodipine 10 mg daily as ordered, monitor at home, and f/u with PCP in 2 weeks.   - BP goal <130/80 - monitor and log blood pressures at home - check around the same time each day in a relaxed setting - Limit salt to <2000 mg/day - Follow DASH eating plan (heart healthy diet) - limit alcohol to 2 standard drinks per day for men and 1 per day for women - avoid tobacco products - get at least 2 hours of regular aerobic exercise weekly Patient aware of signs/symptoms requiring further/urgent evaluation. Labs updated today.      Relevant Medications   amLODipine (NORVASC) 10 MG tablet   Other Relevant Orders   Comprehensive metabolic panel   CBC   Other Visit Diagnoses     Pre-employment examination       Relevant Orders   Comprehensive metabolic panel   CBC       Return in about 2 weeks (around 12/24/2021) for HTN f/u w Melissa.    Clayborne Dana, NP

## 2021-12-25 ENCOUNTER — Ambulatory Visit: Payer: BC Managed Care – PPO | Admitting: Family

## 2021-12-25 VITALS — BP 130/88 | HR 84 | Temp 98.2°F | Resp 16 | Ht 66.0 in | Wt 179.0 lb

## 2021-12-25 DIAGNOSIS — Z111 Encounter for screening for respiratory tuberculosis: Secondary | ICD-10-CM | POA: Diagnosis not present

## 2021-12-25 DIAGNOSIS — I10 Essential (primary) hypertension: Secondary | ICD-10-CM

## 2021-12-25 DIAGNOSIS — Z0184 Encounter for antibody response examination: Secondary | ICD-10-CM

## 2021-12-25 NOTE — Assessment & Plan Note (Addendum)
Initial bp was mildly elevated. Follow up bp wnl.  Continue amlodipine 10mg . Recent CBC and BMET reviewed and stable.

## 2021-12-25 NOTE — Assessment & Plan Note (Signed)
Needs titers for school. See below.

## 2021-12-25 NOTE — Progress Notes (Signed)
Subjective:   By signing my name below, I, Carylon Perches, attest that this documentation has been prepared under the direction and in the presence of Karie Chimera, NP 12/25/2021    Patient ID: Angela Pollard, female    DOB: 1986-01-18, 36 y.o.   MRN: [id number]  No chief complaint on file.   HPI Patient is in today for an office visit.  Blood pressure: In clinic today her blood pressure is Initially high, but is 130/88 on recheck. She reports that she was late in taking her last dose on the night of 12/24/21. Otherwise, she is compliant with 10 mg amlodipine.  She denies any side effects.  In the past 2 weeks she has not been monitoring her blood pressure consistently. BP Readings from Last 3 Encounters:  12/25/21 130/88  12/10/21 (!) 141/105  10/29/21 (!) 146/80   Pulse Readings from Last 3 Encounters:  12/25/21 84  12/10/21 82  10/29/21 90    TB Screening: She also presents today for a tuberculosis screening required by her school. She states that she will need Varicella Hep B,  TB Gold, and MMR titer.    Health Maintenance Due  Topic Date Due   COVID-19 Vaccine (1) Never done   HPV VACCINES (3 - 3-dose series) 03/29/2008   PAP SMEAR-Modifier  01/01/2022    Past Medical History:  Diagnosis Date   Hypertension    x 4-5 years    Past Surgical History:  Procedure Laterality Date   CESAREAN SECTION  09/2020   NO PAST SURGERIES      Family History  Problem Relation Age of Onset   Brain cancer Mother 29   Cancer Mother 74       brain   Kidney failure Father    CVA Father    Hypertension Father    Diabetes Mellitus II Father    Hyperlipidemia Father    Diabetes Mellitus II Half-Sister    Heart failure Maternal Aunt    Diabetes Neg Hx     Social History   Socioeconomic History   Marital status: Divorced    Spouse name: Not on file   Number of children: 1   Years of education: Not on file   Highest education level: Bachelor's degree (e.g.,  BA, AB, BS)  Occupational History   Not on file  Tobacco Use   Smoking status: Former    Packs/day: 0.30    Years: 8.00    Total pack years: 2.40    Types: Cigarettes   Smokeless tobacco: Never  Vaping Use   Vaping Use: Never used  Substance and Sexual Activity   Alcohol use: Not Currently    Comment: occasional   Drug use: Never   Sexual activity: Yes    Partners: Male    Birth control/protection: I.U.D.  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[employer], pt is a Marine scientist   Not married   Working on [employer] degree online   Has a son born 2008, son born 2022   Lives with significant other and son   Enjoys spending time with family, spending time with family, hiking   Social Determinants of Health   Financial Resource Strain: Not on file  Food Insecurity: Not on file  Transportation Needs: Not on file  Physical Activity: Not on file  Stress: Not on file  Social Connections: Not on file  Intimate Partner Violence: Not on file  Outpatient Medications Prior to Visit  Medication Sig Dispense Refill   acetaminophen (TYLENOL) 325 MG tablet Take 650 mg by mouth every 6 (six) hours as needed.     amLODipine (NORVASC) 10 MG tablet Take 1 tablet (10 mg total) by mouth daily. 90 tablet 1   Lecithin 1200 MG CAPS Take by mouth.     levonorgestrel (LILETTA, 52 MG,) 20.1 MCG/DAY IUD 1 each by Intrauterine route once.     Multiple Vitamin (MULTI-VITAMIN) tablet Take 1 tablet by mouth daily.     No facility-administered medications prior to visit.    No Known Allergies  ROS    See HPI  Objective:    Physical Exam Constitutional:      General: She is not in acute distress.    Appearance: Normal appearance. She is not ill-appearing.  HENT:     Head: Normocephalic and atraumatic.     Right Ear: External ear normal.     Left Ear: External ear normal.  Eyes:     Extraocular Movements: Extraocular movements intact.     Pupils: Pupils are  equal, round, and reactive to light.  Cardiovascular:     Rate and Rhythm: Normal rate and regular rhythm.     Heart sounds: Normal heart sounds. No murmur heard.    No gallop.  Pulmonary:     Effort: Pulmonary effort is normal. No respiratory distress.     Breath sounds: Normal breath sounds. No wheezing or rales.  Skin:    General: Skin is warm and dry.  Neurological:     Mental Status: She is alert and oriented to person, place, and time.  Psychiatric:        Mood and Affect: Mood normal.        Behavior: Behavior normal.        Judgment: Judgment normal.     BP 130/88 (BP Location: Right Arm, Patient Position: Sitting, Cuff Size: Small)   Pulse 84   Temp 98.2 F (36.8 C) (Oral)   Resp 16   Ht 5' 6"  (1.676 m)   Wt 179 lb (81.2 kg)   SpO2 99%   BMI 28.89 kg/m  Wt Readings from Last 3 Encounters:  12/25/21 179 lb (81.2 kg)  12/10/21 176 lb 12.8 oz (80.2 kg)  10/29/21 179 lb 12.8 oz (81.6 kg)       Assessment & Plan:   Problem List Items Addressed This Visit       Unprioritized   Tuberculosis screening    Check TB gold.       Relevant Orders   QuantiFERON-TB Gold Plus   Immunity status testing - Primary    Needs titers for school. See below.       Relevant Orders   Varicella-Zoster Virus Antibody (Immunity Screen), ACIF ,Serum   Measles/Mumps/Rubella Immunity   Hepatitis B surface antibody,quantitative   Essential hypertension    Initial bp was mildly elevated. Follow up bp wnl.  Continue amlodipine 50m. Recent CBC and BMET reviewed and stable.        No orders of the defined types were placed in this encounter.   I, MNance Pear NP, personally preformed the services described in this documentation.  All medical record entries made by the scribe were at my direction and in my presence.  I have reviewed the chart and discharge instructions (if applicable) and agree that the record reflects my personal performance and is accurate and complete.  12/25/2021  I,Amber Collins,acting as a scribe  for [name], NP.,have documented all relevant documentation on the behalf of [name], NP,as directed by  [name], NP while in the presence of [name], NP.  [name], NP

## 2021-12-25 NOTE — Assessment & Plan Note (Addendum)
Check TB gold.

## 2021-12-29 LAB — QUANTIFERON-TB GOLD PLUS
Mitogen-NIL: >10 IU/mL
NIL: 0.08 IU/mL
QuantiFERON-TB Gold Plus: NEGATIVE
TB1-NIL: 0.01 IU/mL
TB2-NIL: <0 IU/mL

## 2021-12-30 LAB — VARICELLA-ZOSTER VIRUS AB(IMMUNITY SCREEN),ACIF,SERUM: VARICELLA ZOSTER VIRUS AB (IMMUNITY SCR),ACIF SERUM: >=1:4 {titer}

## 2021-12-30 LAB — MEASLES/MUMPS/RUBELLA IMMUNITY
Mumps IgG: 98.5 AU/mL
Rubella: 2.05 Index
Rubeola IgG: 298 AU/mL

## 2021-12-30 LAB — HEPATITIS B SURFACE ANTIBODY, QUANTITATIVE: Hep B S AB Quant (Post): 695 m[IU]/mL (ref >=10)

## 2022-01-05 ENCOUNTER — Telehealth: Payer: Self-pay | Admitting: Family

## 2022-01-05 NOTE — Telephone Encounter (Signed)
Pt is needing recent labs and all immunization records. She asked if these could be uploaded to her email or sent via mychart, please advise.

## 2022-01-05 NOTE — Telephone Encounter (Signed)
Tried to call but was unable to leave message, vm was full.  Immunization record emailed.

## 2022-01-11 ENCOUNTER — Other Ambulatory Visit (HOSPITAL_BASED_OUTPATIENT_CLINIC_OR_DEPARTMENT_OTHER): Payer: Self-pay

## 2022-01-11 ENCOUNTER — Ambulatory Visit: Payer: BC Managed Care – PPO | Admitting: Family

## 2022-01-11 VITALS — BP 136/93 | HR 73 | Temp 97.6°F | Resp 16 | Ht 66.0 in | Wt 175.2 lb

## 2022-01-11 DIAGNOSIS — J40 Bronchitis, not specified as acute or chronic: Secondary | ICD-10-CM | POA: Insufficient documentation

## 2022-01-11 MED ORDER — AZITHROMYCIN 250 MG PO TABS
ORAL_TABLET | ORAL | 0 refills | Status: AC
  Filled 2022-01-11: qty 6, 5d supply, fill #0

## 2022-01-11 MED ORDER — BENZONATATE 100 MG PO CAPS
100.0000 mg | ORAL_CAPSULE | Freq: Three times a day (TID) | ORAL | 0 refills | Status: DC | PRN
  Filled 2022-01-11: qty 20, 7d supply, fill #0

## 2022-01-11 NOTE — Patient Instructions (Signed)
Please begin zpak and as needed tessalon. Call if symptoms worsen or if symptoms fail to improve.

## 2022-01-11 NOTE — Progress Notes (Signed)
Subjective:     Patient ID: Angela Pollard, female    DOB: September 15, 1985, 36 y.o.   MRN: [id number]  Chief Complaint  Patient presents with   Cough    Here for Coughing     Cough   Patient is in today for cough. Has been present for 3 weeks. Denies associated fever. She has tried mucinex which helps briefly. Wakes up with coughing fits.  Cough is very productive and interfering with sleep.   Health Maintenance Due  Topic Date Due   HPV VACCINES (3 - 3-dose series) 03/29/2008   PAP SMEAR-Modifier  01/01/2022    Past Medical History:  Diagnosis Date   Hypertension    x 4-5 years    Past Surgical History:  Procedure Laterality Date   CESAREAN SECTION  09/2020   NO PAST SURGERIES      Family History  Problem Relation Age of Onset   Brain cancer Mother 21   Cancer Mother 67       brain   Kidney failure Father    CVA Father    Hypertension Father    Diabetes Mellitus II Father    Hyperlipidemia Father    Diabetes Mellitus II Half-Sister    Heart failure Maternal Aunt    Diabetes Neg Hx     Social History   Socioeconomic History   Marital status: Divorced    Spouse name: Not on file   Number of children: 1   Years of education: Not on file   Highest education level: Bachelor's degree (e.g., BA, AB, BS)  Occupational History   Not on file  Tobacco Use   Smoking status: Former    Packs/day: 0.30    Years: 8.00    Total pack years: 2.40    Types: Cigarettes   Smokeless tobacco: Never  Vaping Use   Vaping Use: Never used  Substance and Sexual Activity   Alcohol use: Not Currently    Comment: occasional   Drug use: Never   Sexual activity: Yes    Partners: Male    Birth control/protection: I.U.D.  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[employer], pt is a Engineer, civil (consulting)   Not married   Working on [employer] degree online   Has a son born 2008, son born 2022   Lives with significant other and son   Enjoys spending time with  family, spending time with family, hiking   Social Determinants of Health   Financial Resource Strain: Not on file  Food Insecurity: Not on file  Transportation Needs: Not on file  Physical Activity: Not on file  Stress: Not on file  Social Connections: Not on file  Intimate Partner Violence: Not on file    Outpatient Medications Prior to Visit  Medication Sig Dispense Refill   acetaminophen (TYLENOL) 325 MG tablet Take 650 mg by mouth every 6 (six) hours as needed.     amLODipine (NORVASC) 10 MG tablet Take 1 tablet (10 mg total) by mouth daily. 90 tablet 1   Lecithin 1200 MG CAPS Take by mouth.     levonorgestrel (LILETTA, 52 MG,) 20.1 MCG/DAY IUD 1 each by Intrauterine route once.     Multiple Vitamin (MULTI-VITAMIN) tablet Take 1 tablet by mouth daily.     No facility-administered medications prior to visit.    No Known Allergies  Review of Systems  Respiratory:  Positive for cough.    See HPI  Objective:    Physical Exam Constitutional:      General: She is not in acute distress.    Appearance: Normal appearance. She is well-developed.  HENT:     Head: Normocephalic and atraumatic.     Right Ear: External ear normal.     Left Ear: External ear normal.  Eyes:     General: No scleral icterus. Neck:     Thyroid: No thyromegaly.  Cardiovascular:     Rate and Rhythm: Normal rate and regular rhythm.     Heart sounds: Normal heart sounds. No murmur heard. Pulmonary:     Effort: Pulmonary effort is normal. No respiratory distress.     Breath sounds: Normal breath sounds. No wheezing.  Musculoskeletal:     Cervical back: Neck supple.  Skin:    General: Skin is warm and dry.  Neurological:     Mental Status: She is alert and oriented to person, place, and time.  Psychiatric:        Mood and Affect: Mood normal.        Behavior: Behavior normal.        Thought Content: Thought content normal.        Judgment: Judgment normal.     BP (!) 136/93 (BP  Location: Right Arm, Patient Position: Sitting)   Pulse 73   Temp 97.6 F (36.4 C) (Oral)   Resp 16   Ht 5\' 6"  (1.676 m)   Wt 175 lb 3.2 oz (79.5 kg)   SpO2 98%   BMI 28.28 kg/m  Wt Readings from Last 3 Encounters:  01/11/22 175 lb 3.2 oz (79.5 kg)  12/25/21 179 lb (81.2 kg)  12/10/21 176 lb 12.8 oz (80.2 kg)       Assessment & Plan:   Problem List Items Addressed This Visit       Unprioritized   Bronchitis - Primary    New. Will rx with azithromycin and prn tessalon. Pt is advised to call if symptoms worsen or if symptoms fail to improve.         I am having Angela Pollard start on benzonatate and azithromycin. I am also having her maintain her acetaminophen, Multi-Vitamin, Lecithin, Liletta (52 MG), and amLODipine.  Meds ordered this encounter  Medications   benzonatate (TESSALON) 100 MG capsule    Sig: Take 1 capsule (100 mg total) by mouth 3 (three) times daily as needed.    Dispense:  20 capsule    Refill:  0    Order Specific Question:   Supervising Provider    Answer:   12/12/21 A [[id number]]   azithromycin (ZITHROMAX) 250 MG tablet    Sig: Take 2 tablets by mouth on day 1, then 1 tablet daily on days 2 through 5    Dispense:  6 tablet    Refill:  0    Order Specific Question:   Supervising Provider    Answer:   Danise Edge A [[id number]]

## 2022-01-11 NOTE — Assessment & Plan Note (Signed)
New. Will rx with azithromycin and prn tessalon. Pt is advised to call if symptoms worsen or if symptoms fail to improve.

## 2022-03-22 ENCOUNTER — Other Ambulatory Visit (HOSPITAL_BASED_OUTPATIENT_CLINIC_OR_DEPARTMENT_OTHER): Payer: Self-pay

## 2022-03-22 MED ORDER — FLUARIX QUADRIVALENT 0.5 ML IM SUSY
PREFILLED_SYRINGE | INTRAMUSCULAR | 0 refills | Status: DC
  Filled 2022-03-22: qty 0.5, 1d supply, fill #0

## 2022-03-30 ENCOUNTER — Other Ambulatory Visit (HOSPITAL_BASED_OUTPATIENT_CLINIC_OR_DEPARTMENT_OTHER): Payer: Self-pay

## 2022-05-01 IMAGING — US US MFM OB FOLLOW-UP
1 series · 14 of 28 positions shown · non-contrast
Comparison: none

[Series 1: us mfm ob follow-up · 84 acquisitions, 14 frames shown]
[im 4/84]
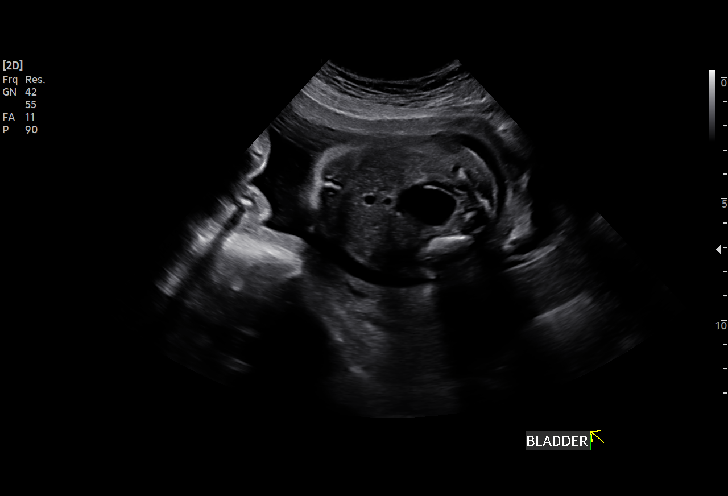
[im 10/84]
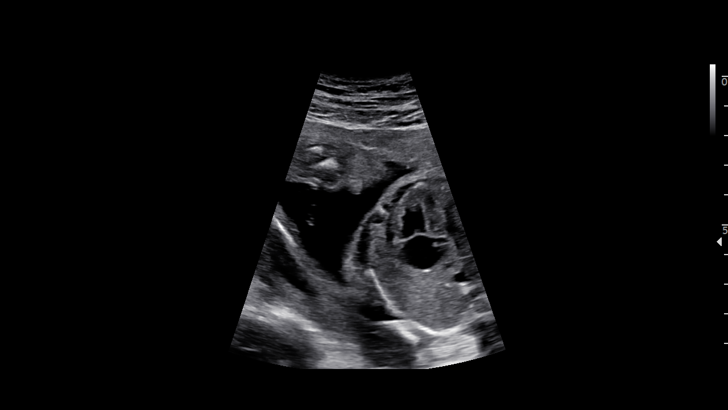
[im 16/84]
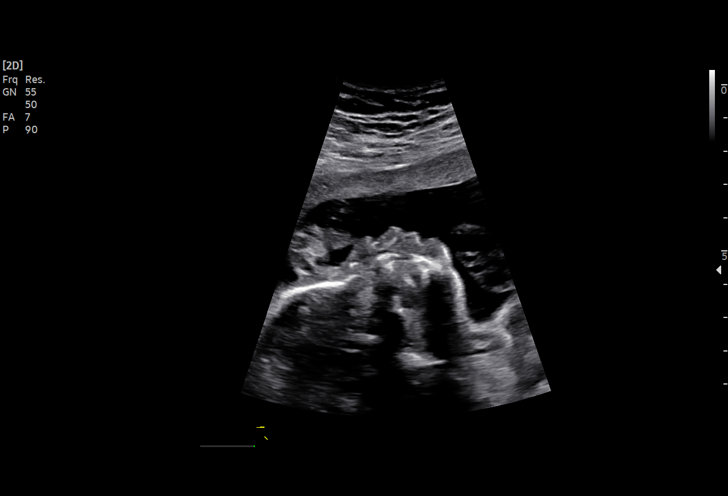
[im 22/84]
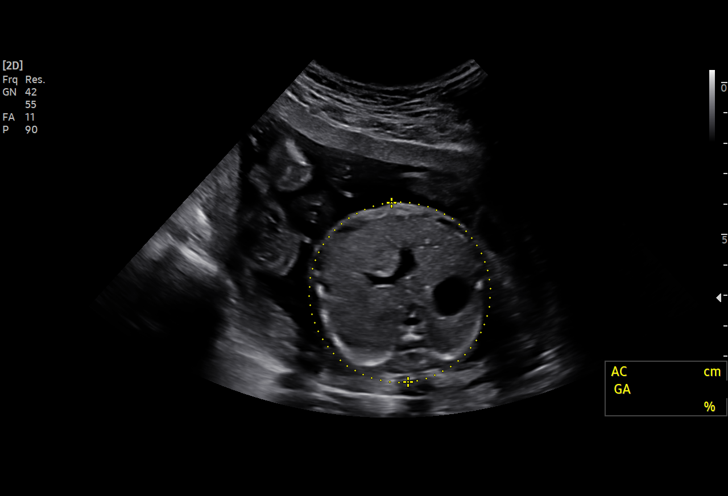
[im 28/84]
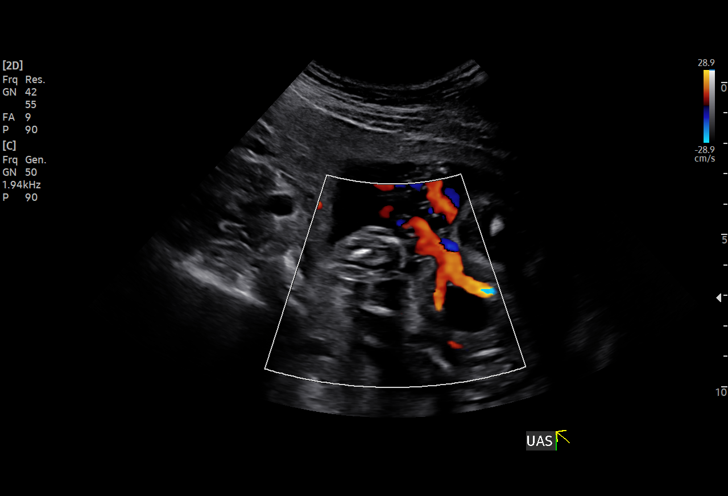
[im 34/84]
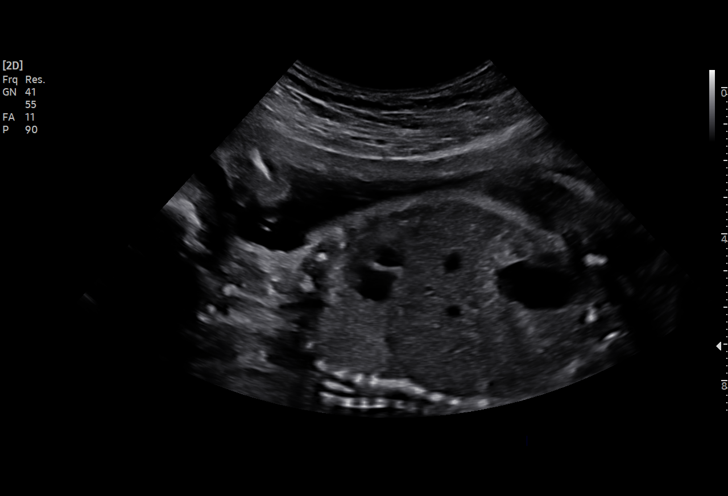
[im 40/84]
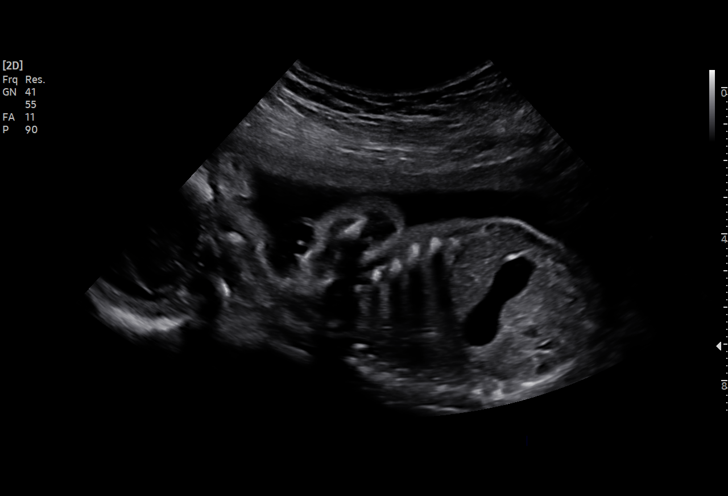
[im 47/84]
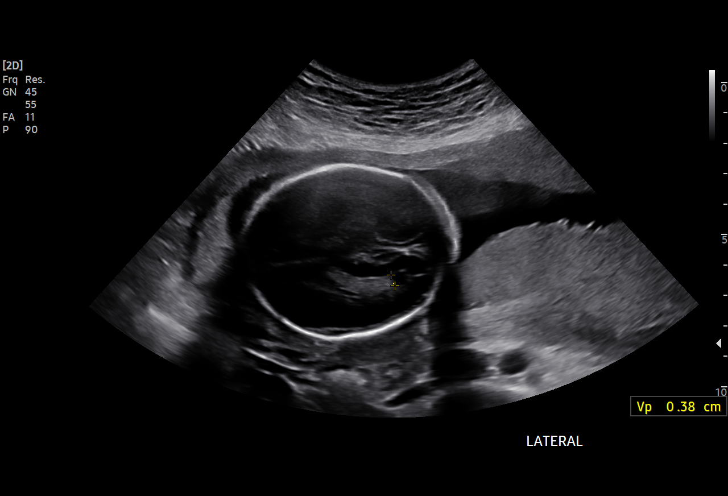
[im 53/84]
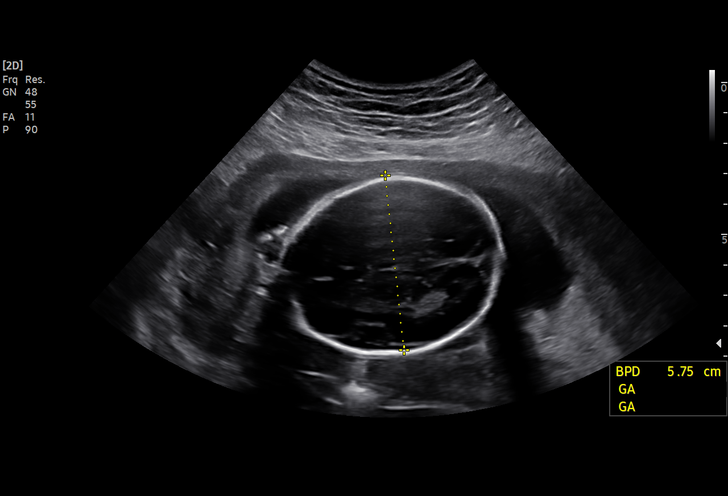
[im 59/84]
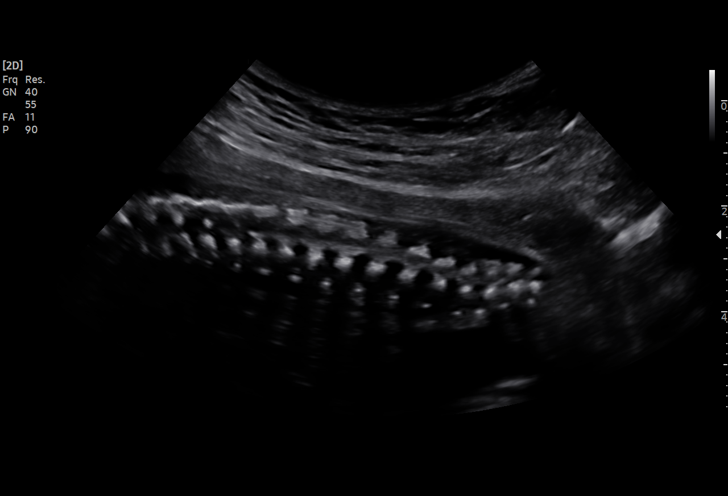
[im 65/84]
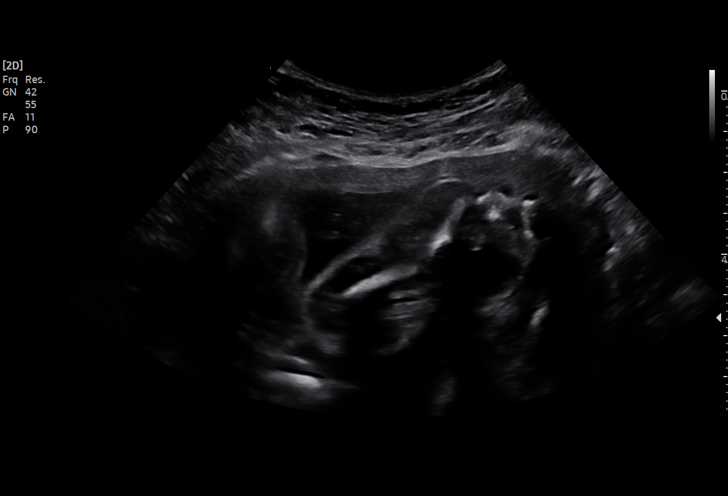
[im 71/84]
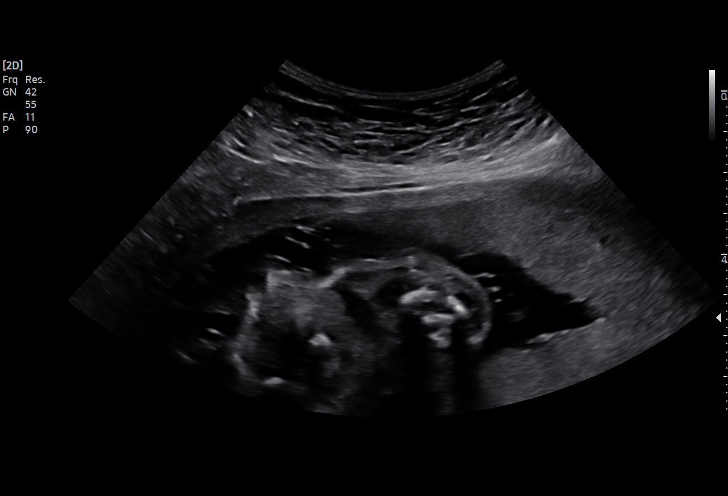
[im 77/84]
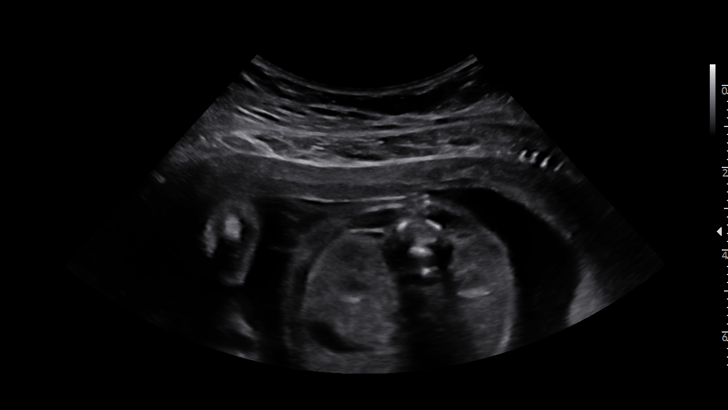
[im 84/84]
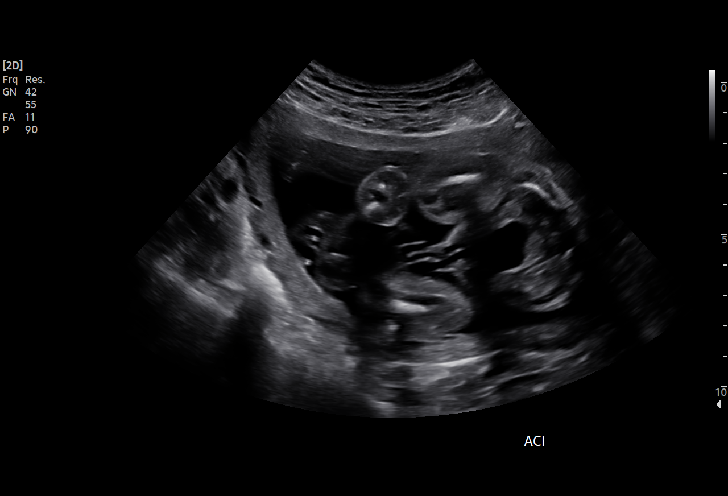

[14 of 28 positions shown; findings below may reference images not displayed]

Indications

 24 weeks gestation of pregnancy
Fetal Evaluation

 Num Of Fetuses:         1
 Fetal Heart Rate(bpm):  148
 Cardiac Activity:       Observed
 Presentation:           Breech
 Placenta:               Left lateral
 P. Cord Insertion:      Previously Visualized

 Amniotic Fluid
 AFI FV:      Within normal limits

                             Largest Pocket(cm)

Biometry

 BPD:        57  mm     G. Age:  23w 3d         23  %    CI:        72.34   %    70 - 86
                                                         FL/HC:      19.0   %    18.7 -
 HC:      213.2  mm     G. Age:  23w 3d         13  %    HC/AC:      1.14        1.05 -
 AC:      187.2  mm     G. Age:  23w 3d         26  %    FL/BPD:     71.2   %    71 - 87
 FL:       40.6  mm     G. Age:  23w 1d         14  %    FL/AC:      21.7   %    20 - 24
 CER:      23.7  mm     G. Age:  21w 6d         14  %

 LV:        4.1  mm

 Est. FW:     586  gm      1 lb 5 oz     16  %
OB History
 Gravidity:    2         Term:   1
Gestational Age

 LMP:           24w 0d        Date:  02/07/20                 EDD:   11/13/20
 U/S Today:     23w 3d                                        EDD:   11/17/20
 Best:          24w 0d     Det. By:  LMP  (02/07/20)          EDD:   11/13/20
Anatomy

 Cranium:               Appears normal         LVOT:                   Appears normal; EIF
 Cavum:                 Appears normal         Aortic Arch:            Appears normal
 Ventricles:            Appears normal         Ductal Arch:            Appears normal
 Choroid Plexus:        Previously seen        Diaphragm:              Appears normal
 Cerebellum:            Appears normal         Stomach:                Appears normal, left
                                                                       sided
 Posterior Fossa:       Appears normal         Abdomen:                Appears normal
 Nuchal Fold:           Not applicable (>20    Abdominal Wall:         Appears nml (cord
                        wks GA)                                        insert, abd wall)
 Face:                  Orbits and profile     Cord Vessels:           Appears normal (3
                        previously seen                                vessel cord)
 Lips:                  Previously seen        Kidneys:                Appear normal
 Palate:                Not well visualized    Bladder:                Appears normal
 Thoracic:              Appears normal         Spine:                  Appears normal
 Heart:                 Appears normal         Upper Extremities:      Previously seen
                        (4CH, axis, and
                        situs)
 RVOT:                  Not well visualized    Lower Extremities:      Previously seen

 Other:  Parents do not wish to know sex of fetus.  Nasal bone previously
         visualized. Heels/feet and open hands/5th digits,IVC/SVC prev.
         visualized.
Cervix Uterus Adnexa

 Cervix
 Length:            3.4  cm.
 Closed
Impression

 Follow up growth due to elevated AFP, with known chronic
 hypertension on meds.
 Normal interval growth with measurements consistent with
 dates
 Good fetal movement and amniotic fluid volume

 Blood pressure was 118/73
 Spine is normal
Recommendations

 Continue serial growth every 4 weeks.

## 2022-05-03 ENCOUNTER — Telehealth: Payer: BC Managed Care – PPO | Admitting: Family

## 2022-05-24 ENCOUNTER — Ambulatory Visit: Payer: BC Managed Care – PPO | Admitting: Family

## 2022-05-24 ENCOUNTER — Other Ambulatory Visit (HOSPITAL_BASED_OUTPATIENT_CLINIC_OR_DEPARTMENT_OTHER): Payer: Self-pay

## 2022-05-24 VITALS — BP 142/95 | HR 65 | Temp 97.9°F | Resp 16 | Wt 180.0 lb

## 2022-05-24 DIAGNOSIS — M546 Pain in thoracic spine: Secondary | ICD-10-CM

## 2022-05-24 DIAGNOSIS — I10 Essential (primary) hypertension: Secondary | ICD-10-CM | POA: Diagnosis not present

## 2022-05-24 MED ORDER — METHOCARBAMOL 500 MG PO TABS
500.0000 mg | ORAL_TABLET | Freq: Three times a day (TID) | ORAL | 0 refills | Status: DC | PRN
  Filled 2022-05-24: qty 20, 7d supply, fill #0

## 2022-05-24 MED ORDER — HYDROCHLOROTHIAZIDE 25 MG PO TABS
25.0000 mg | ORAL_TABLET | Freq: Every day | ORAL | 0 refills | Status: DC
  Filled 2022-05-24: qty 90, 90d supply, fill #0

## 2022-05-24 NOTE — Assessment & Plan Note (Signed)
Wt Readings from Last 3 Encounters:  05/24/22 180 lb (81.6 kg)  01/11/22 175 lb 3.2 oz (79.5 kg)  12/25/21 179 lb (81.2 kg)   BP Readings from Last 3 Encounters:  05/24/22 (!) 143/97  01/11/22 (!) 136/93  12/25/21 130/88   Uncontrolled on amlodipine 10mg .  Will add hctz once daily.

## 2022-05-24 NOTE — Progress Notes (Signed)
Subjective:   By signing my name below, I, Gilman Buttner, attest that this documentation has been prepared under the direction and in the presence of Sandford Craze, 05/24/2022.   Patient ID: Angela Pollard, female    DOB: 05-Dec-1985, 36 y.o.   MRN: [id number]  No chief complaint on file.   HPI Patient is in today for an office visit.  Muscle pain Patient is complaining of muscle pain starting with her back and is now with her thoracic spine near clavicle starting 3 days. She states that it is painful to turn her head and to lift her arm. Patient confirms she experiences some numbness in her arm, but suspects it may be due to pain. She manages her symptoms with Motrin and a heating pad. She denies any injury to the area and tenderness.  Hypertension Patient is complaint with her 10 mg Norvasc and states that she checks her blood pressure every once in a while at work.  Health Maintenance Due  Topic Date Due   HPV VACCINES (3 - 3-dose series) 03/29/2008   PAP SMEAR-Modifier  01/01/2022    Past Medical History:  Diagnosis Date   Hypertension    x 4-5 years    Past Surgical History:  Procedure Laterality Date   CESAREAN SECTION  09/2020   NO PAST SURGERIES      Family History  Problem Relation Age of Onset   Brain cancer Mother 32   Cancer Mother 35       brain   Kidney failure Father    CVA Father    Hypertension Father    Diabetes Mellitus II Father    Hyperlipidemia Father    Diabetes Mellitus II Half-Sister    Heart failure Maternal Aunt    Diabetes Neg Hx     Social History   Socioeconomic History   Marital status: Divorced    Spouse name: Not on file   Number of children: 1   Years of education: Not on file   Highest education level: Bachelor's degree (e.g., BA, AB, BS)  Occupational History   Not on file  Tobacco Use   Smoking status: Former    Packs/day: 0.30    Years: 8.00    Total pack years: 2.40    Types: Cigarettes   Smokeless  tobacco: Never  Vaping Use   Vaping Use: Never used  Substance and Sexual Activity   Alcohol use: Not Currently    Comment: occasional   Drug use: Never   Sexual activity: Yes    Partners: Male    Birth control/protection: I.U.D.  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[employer], pt is a Engineer, civil (consulting)   Not married   Working on [employer] degree online   Has a son born 2008, son born 2022   Lives with significant other and son   Enjoys spending time with family, spending time with family, hiking   Social Determinants of Health   Financial Resource Strain: Not on file  Food Insecurity: Not on file  Transportation Needs: Not on file  Physical Activity: Not on file  Stress: Not on file  Social Connections: Not on file  Intimate Partner Violence: Not on file    Outpatient Medications Prior to Visit  Medication Sig Dispense Refill   acetaminophen (TYLENOL) 325 MG tablet Take 650 mg by mouth every 6 (six) hours as needed.     amLODipine (NORVASC) 10 MG tablet  Take 1 tablet (10 mg total) by mouth daily. 90 tablet 1   benzonatate (TESSALON) 100 MG capsule Take 1 capsule (100 mg total) by mouth 3 (three) times daily as needed. 20 capsule 0   influenza vac split quadrivalent PF (FLUARIX QUADRIVALENT) 0.5 ML injection Inject into the muscle. 0.5 mL 0   Lecithin 1200 MG CAPS Take by mouth.     levonorgestrel (LILETTA, 52 MG,) 20.1 MCG/DAY IUD 1 each by Intrauterine route once.     Multiple Vitamin (MULTI-VITAMIN) tablet Take 1 tablet by mouth daily.     No facility-administered medications prior to visit.    No Known Allergies  Review of Systems  Musculoskeletal:        (+) pain with thoracic spine near clavicle       Objective:    Physical Exam Constitutional:      General: She is not in acute distress.    Appearance: Normal appearance. She is not ill-appearing.  HENT:     Head: Normocephalic and atraumatic.     Right Ear: External ear normal.      Left Ear: External ear normal.  Eyes:     Extraocular Movements: Extraocular movements intact.     Pupils: Pupils are equal, round, and reactive to light.  Cardiovascular:     Rate and Rhythm: Normal rate and regular rhythm.     Heart sounds: Normal heart sounds. No murmur heard.    No gallop.  Pulmonary:     Effort: Pulmonary effort is normal. No respiratory distress.     Breath sounds: Normal breath sounds. No wheezing or rales.  Skin:    General: Skin is warm and dry.  Neurological:     Mental Status: She is alert and oriented to person, place, and time.  Psychiatric:        Mood and Affect: Mood normal.        Behavior: Behavior normal.        Judgment: Judgment normal.     There were no vitals taken for this visit. Wt Readings from Last 3 Encounters:  01/11/22 175 lb 3.2 oz (79.5 kg)  12/25/21 179 lb (81.2 kg)  12/10/21 176 lb 12.8 oz (80.2 kg)       Assessment & Plan:   Problem List Items Addressed This Visit   None  No orders of the defined types were placed in this encounter.   I, [name], personally preformed the services described in this documentation.  All medical record entries made by the scribe were at my direction and in my presence.  I have reviewed the chart and discharge instructions (if applicable) and agree that the record reflects my personal performance and is accurate and complete. 05/24/2022.   I,Verona Buck,acting as a Education administrator for [employer], NP.,have documented all relevant documentation on the behalf of [name], NP,as directed by  [name], NP while in the presence of [name], NP.    Luna Glasgow

## 2022-05-25 DIAGNOSIS — M546 Pain in thoracic spine: Secondary | ICD-10-CM | POA: Insufficient documentation

## 2022-05-25 NOTE — Assessment & Plan Note (Signed)
Improving with ibuprofen. Tried some meloxicam that she had on hand and did not find it more helpful than the ibuprofen.  Will give rx for robaxin prn, continue ibuprofen prn. Call if symptoms worsen or if symptoms do not continue to improve.  Not provided for work for 11/27 and 11/28.

## 2022-05-29 IMAGING — US US MFM OB FOLLOW-UP
1 series · 14 of 28 positions shown · non-contrast
Comparison: none

[Series 1: us mfm ob follow-up · 14 of 71 slices shown]
[im 3/71]
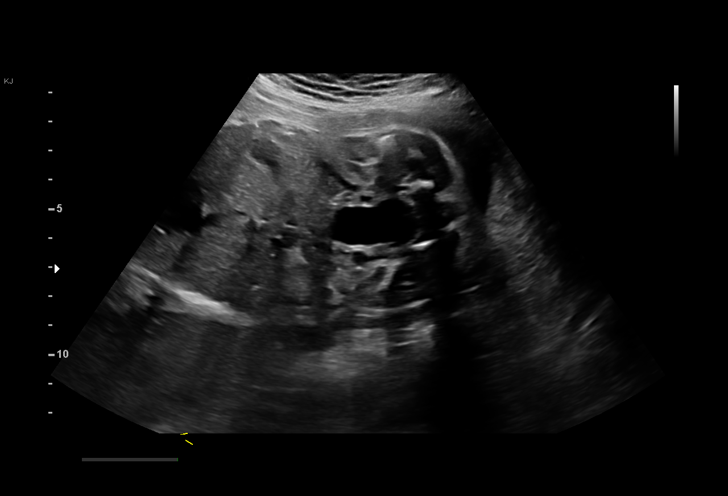
[im 8/71]
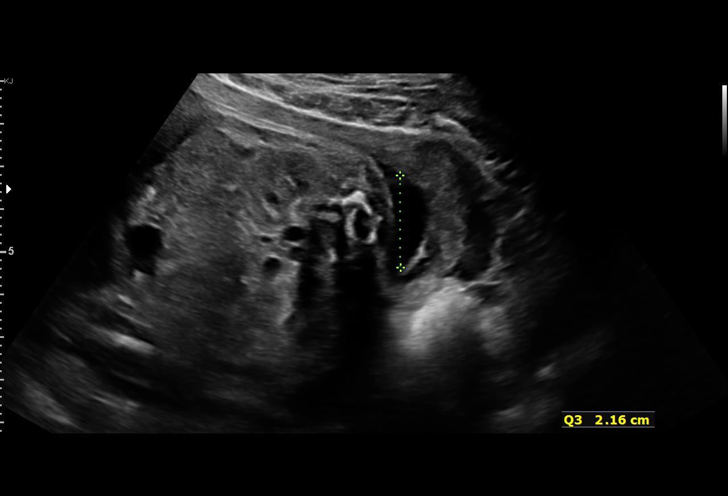
[im 13/71]
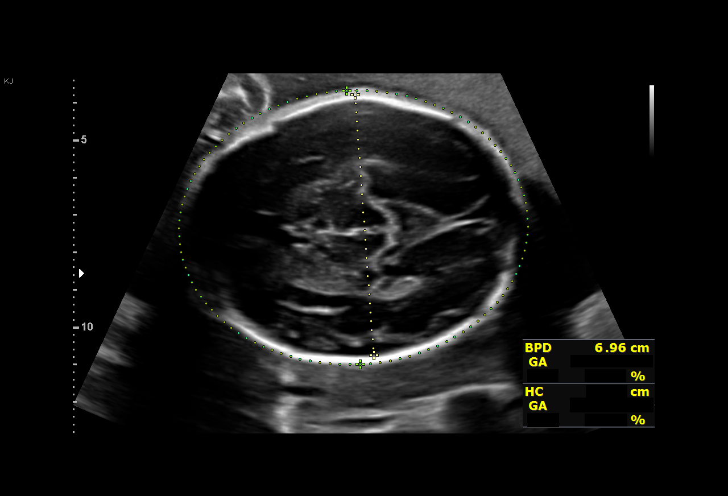
[im 19/71]
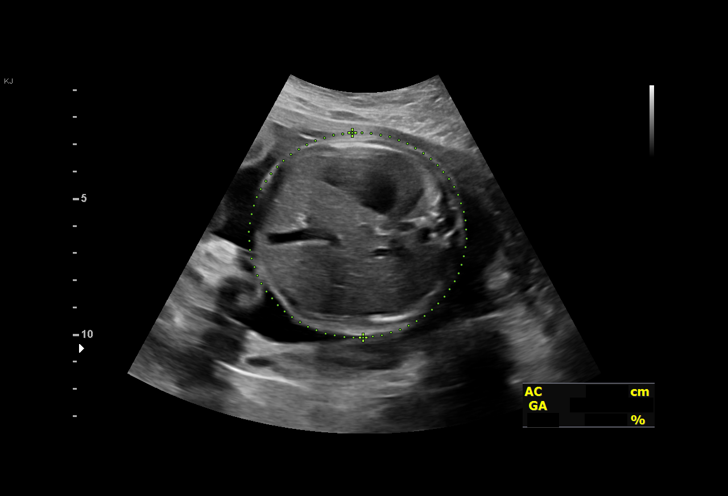
[im 24/71]
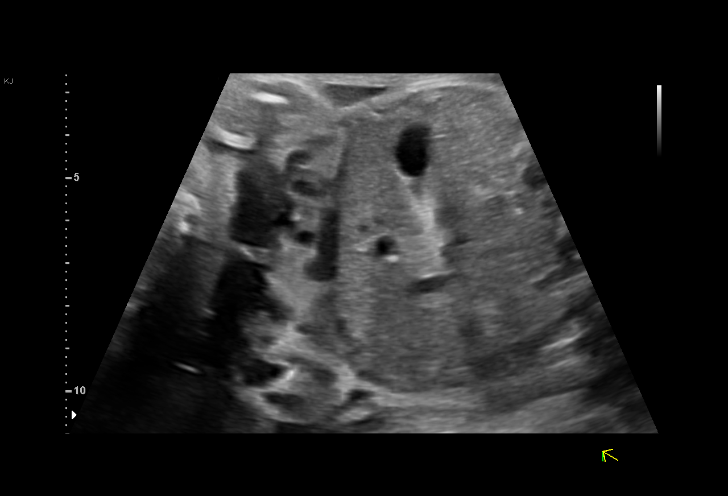
[im 29/71]
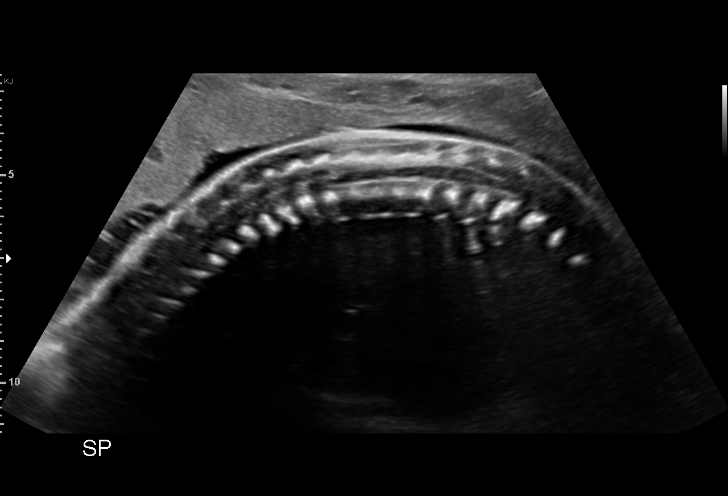
[im 34/71]
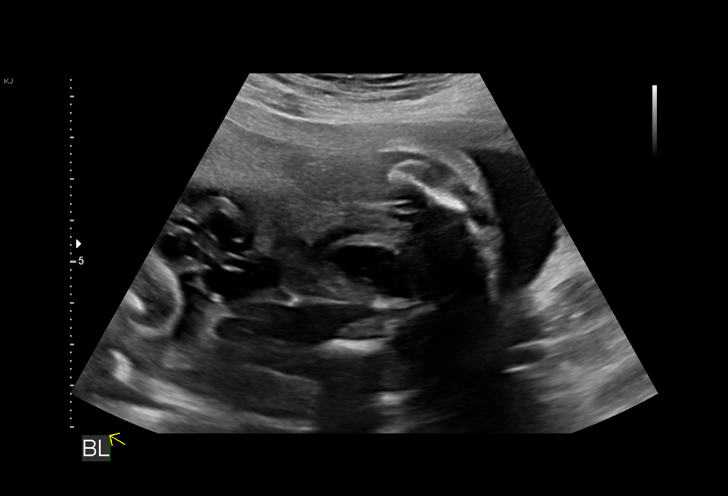
[im 39/71]
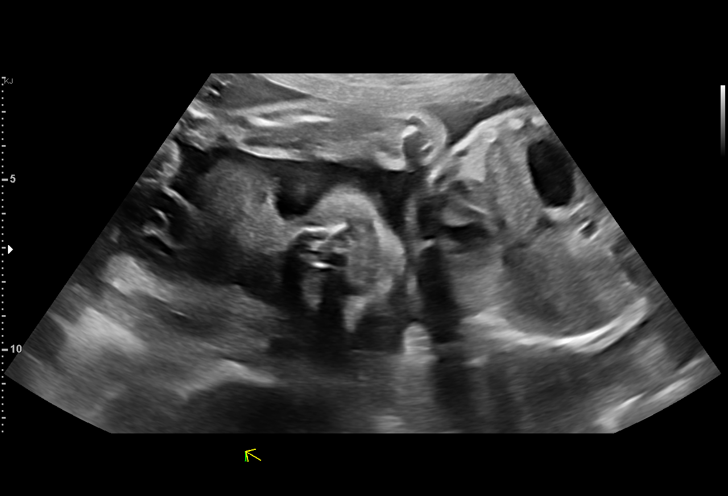
[im 45/71]
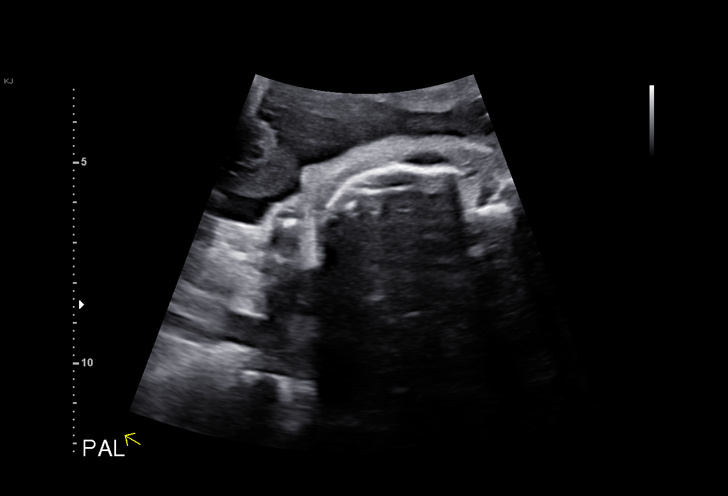
[im 50/71]
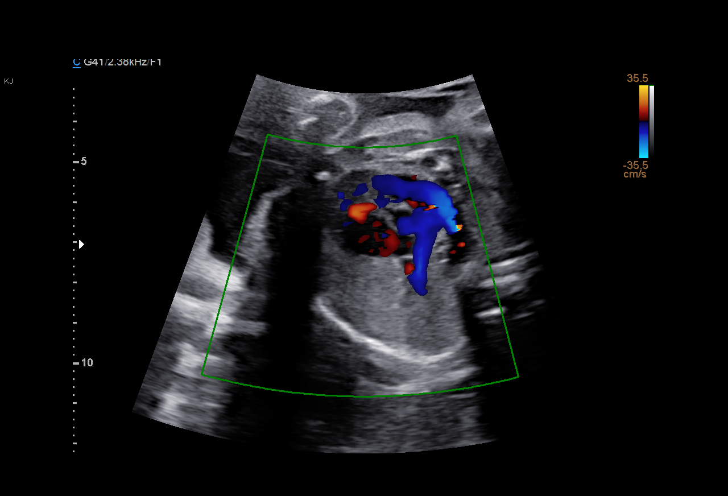
[im 55/71]
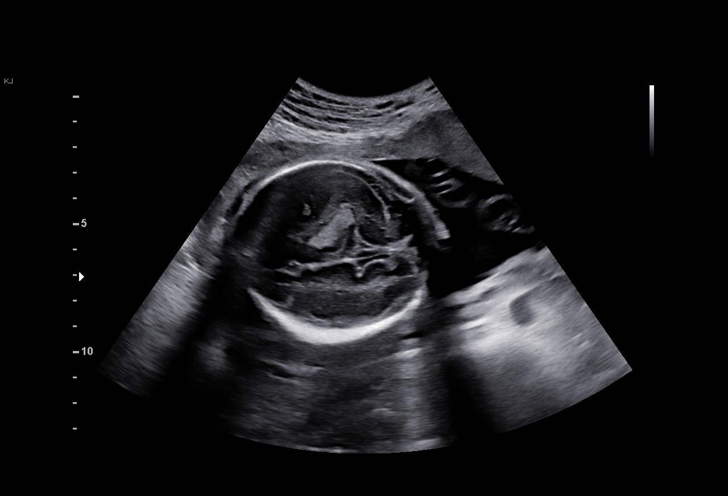
[im 60/71]
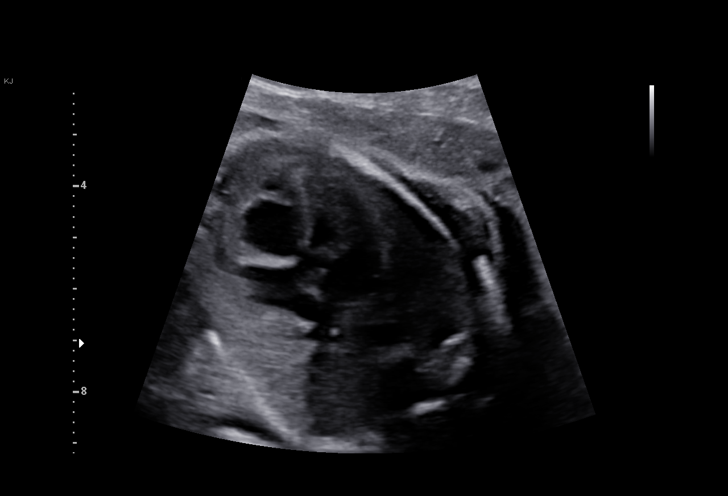
[im 65/71]
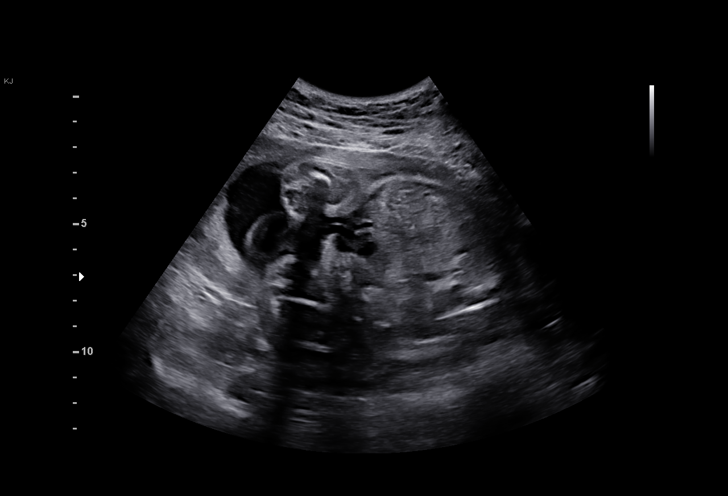
[im 71/71]
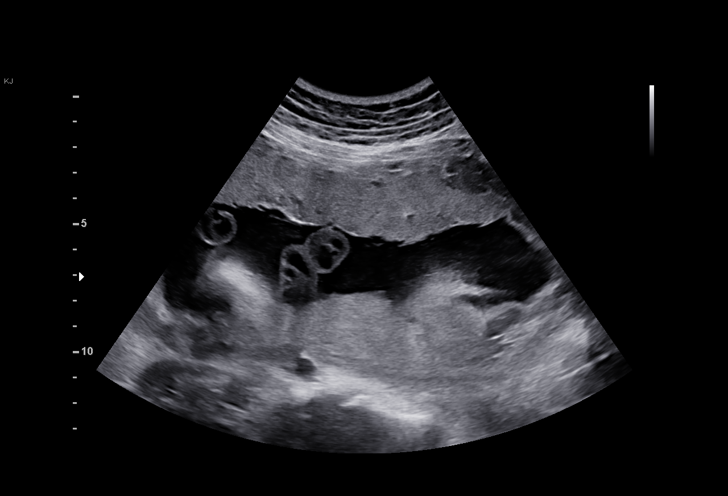

[14 of 28 positions shown; findings below may reference images not displayed]

JAPNEET

Indications

 Abnormal biochemical screen (AFP 3.11)
 28 weeks gestation of pregnancy
 Pre-existing essential hypertension
 complicating pregnancy, third trimester-
 procardia
Fetal Evaluation

 Num Of Fetuses:         1
 Fetal Heart Rate(bpm):  152
 Cardiac Activity:       Observed
 Presentation:           Breech
 Placenta:               Left lateral

 Amniotic Fluid
 AFI FV:      Within normal limits

 AFI Sum(cm)     %Tile       Largest Pocket(cm)
 13.19           38

 RUQ(cm)       RLQ(cm)       LUQ(cm)        LLQ(cm)

Biometry

 BPD:      69.3  mm     G. Age:  27w 6d         33  %    CI:        69.92   %    70 - 86
                                                         FL/HC:      19.4   %    18.8 -
 HC:      264.4  mm     G. Age:  28w 5d         43  %    HC/AC:      1.09        1.05 -
 AC:      243.2  mm     G. Age:  28w 4d         61  %    FL/BPD:     73.9   %    71 - 87
 FL:       51.2  mm     G. Age:  27w 3d         19  %    FL/AC:      21.1   %    20 - 24

 LV:        1.3  mm

 Est. FW:    2259  gm    2 lb 10 oz      43  %
OB History

 Gravidity:    2         Term:   1
Gestational Age

 LMP:           28w 0d        Date:  02/07/20                 EDD:   11/13/20
 U/S Today:     28w 1d                                        EDD:   11/12/20
 Best:          28w 0d     Det. By:  LMP  (02/07/20)          EDD:   11/13/20
Anatomy

 Ventricles:            Appears normal         Diaphragm:              Appears normal
 Cerebellum:            Appears normal         Stomach:                Appears normal, left
                                                                       sided
 Posterior Fossa:       Appears normal         Abdominal Wall:         Appears nml (cord
                                                                       insert, abd wall)
 Palate:                Appears normal         Kidneys:                Appear normal
 Heart:                 Appears normal         Bladder:                Appears normal
 RVOT:                  Appears normal         Spine:                  Appears normal

 Other:  All other anatomy previously seen as normal. The anatomic survey is
         now complete.
Cervix Uterus Adnexa

 Cervix
 Length:           3.74  cm.
 Normal appearance by transabdominal scan.
Comments

 This patient was seen for a follow up growth scan due to an
 unexplained elevated MSAFP of 3.11 MoM and chronic
 hypertension currently treated with Procardia. She denies any
 problems since her last exam.
 She was informed that the fetal growth and amniotic fluid
 level appears appropriate for her gestational age.
 Due to her history of chronic hypertension and the elevated
 MSAFP level, weekly fetal testing will be started at around 32
 weeks.
 A follow-up growth scan and biophysical profile was
 scheduled in 1 week.

## 2022-06-09 ENCOUNTER — Encounter: Payer: Self-pay | Admitting: Family

## 2022-06-09 ENCOUNTER — Other Ambulatory Visit (HOSPITAL_BASED_OUTPATIENT_CLINIC_OR_DEPARTMENT_OTHER): Payer: Self-pay

## 2022-06-09 ENCOUNTER — Ambulatory Visit: Payer: BC Managed Care – PPO | Admitting: Family

## 2022-06-09 VITALS — BP 134/87 | HR 88 | Resp 18 | Ht 66.0 in | Wt 177.0 lb

## 2022-06-09 DIAGNOSIS — I10 Essential (primary) hypertension: Secondary | ICD-10-CM

## 2022-06-09 DIAGNOSIS — F419 Anxiety disorder, unspecified: Secondary | ICD-10-CM | POA: Diagnosis not present

## 2022-06-09 DIAGNOSIS — R739 Hyperglycemia, unspecified: Secondary | ICD-10-CM | POA: Insufficient documentation

## 2022-06-09 LAB — BASIC METABOLIC PANEL
BUN: 15 mg/dL (ref 6–23)
CO2: 34 mEq/L — ABNORMAL HIGH (ref 19–32)
Calcium: 9.7 mg/dL (ref 8.4–10.5)
Chloride: 95 mEq/L — ABNORMAL LOW (ref 96–112)
Creatinine, Ser: 0.73 mg/dL (ref 0.40–1.20)
GFR: 105.32 mL/min (ref >60.00)
Glucose, Bld: 122 mg/dL — ABNORMAL HIGH (ref 70–99)
Potassium: 3.5 mEq/L (ref 3.5–5.1)
Sodium: 138 mEq/L (ref 135–145)

## 2022-06-09 NOTE — Assessment & Plan Note (Signed)
Bp is now at goal with the addition of hctz.  Continue same. Obtain follow up bmet.

## 2022-06-09 NOTE — Progress Notes (Signed)
Subjective:   By signing my name below, I, Shehryar Baig, attest that this documentation has been prepared under the direction and in the presence of Sandford Craze, NP. 06/09/2022   Patient ID: Angela Pollard, female    DOB: 1985/07/25, 36 y.o.   MRN: [id number]  Chief Complaint  Patient presents with   Hypertension   Anxiety    Hypertension Associated symptoms include anxiety.  Anxiety Symptoms include nausea (occasional) and nervous/anxious behavior.     Patient is in today for a follow up visit.   Blood pressure: She was started on 25 mg hydrochlorothiazide daily PO since last visit and her blood pressure as improved. She continues taking  mg amlodipine daily PO. She reports having occasional nausea since starting 25 mg hydrochlorothiazide.  BP Readings from Last 3 Encounters:  06/09/22 134/87  05/24/22 (!) 142/95  01/11/22 (!) 136/93   Pulse Readings from Last 3 Encounters:  06/09/22 88  05/24/22 65  01/11/22 73   Mood: She complains of feeling overwhelmed. She has her upcomming final exams which is getting her anxiety. She is doing less around her house due to feeling down about her final exam and grades. She gets easily distracted while studying. She had no issues with focus while young in school. She also has no focusing issues while at work. She is sleeping well at this time. Her anxiety and depression will improve considerably after she finishes school. She is interested in seeing a counselor to manage her mood.    Past Medical History:  Diagnosis Date   Hypertension    x 4-5 years    Past Surgical History:  Procedure Laterality Date   CESAREAN SECTION  09/2020   NO PAST SURGERIES      Family History  Problem Relation Age of Onset   Brain cancer Mother 19   Cancer Mother 102       brain   Kidney failure Father    CVA Father    Hypertension Father    Diabetes Mellitus II Father    Hyperlipidemia Father    Diabetes Mellitus II Half-Sister     Heart failure Maternal Aunt    Diabetes Neg Hx     Social History   Socioeconomic History   Marital status: Divorced    Spouse name: Not on file   Number of children: 1   Years of education: Not on file   Highest education level: Bachelor's degree (e.g., BA, AB, BS)  Occupational History   Not on file  Tobacco Use   Smoking status: Former    Packs/day: 0.30    Years: 8.00    Total pack years: 2.40    Types: Cigarettes   Smokeless tobacco: Never  Vaping Use   Vaping Use: Never used  Substance and Sexual Activity   Alcohol use: Not Currently    Comment: occasional   Drug use: Never   Sexual activity: Yes    Partners: Male    Birth control/protection: I.U.D.  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[employer], pt is a Engineer, civil (consulting)   Not married   Working on [employer] degree online   Has a son born 2008, son born 2022   Lives with significant other and son   Enjoys spending time with family, spending time with family, hiking   Social Determinants of Health   Financial Resource Strain: Not on file  Food Insecurity: Not on file  Transportation  Needs: Not on file  Physical Activity: Not on file  Stress: Not on file  Social Connections: Not on file  Intimate Partner Violence: Not on file    Outpatient Medications Prior to Visit  Medication Sig Dispense Refill   acetaminophen (TYLENOL) 325 MG tablet Take 650 mg by mouth every 6 (six) hours as needed.     amLODipine (NORVASC) 10 MG tablet Take 1 tablet (10 mg total) by mouth daily. 90 tablet 1   benzonatate (TESSALON) 100 MG capsule Take 1 capsule (100 mg total) by mouth 3 (three) times daily as needed. 20 capsule 0   hydrochlorothiazide (HYDRODIURIL) 25 MG tablet Take 1 tablet (25 mg total) by mouth daily. 90 tablet 0   influenza vac split quadrivalent PF (FLUARIX QUADRIVALENT) 0.5 ML injection Inject into the muscle. 0.5 mL 0   levonorgestrel (LILETTA, 52 MG,) 20.1 MCG/DAY IUD 1 each by  Intrauterine route once.     methocarbamol (ROBAXIN) 500 MG tablet Take 1 tablet (500 mg total) by mouth every 8 (eight) hours as needed for muscle spasms. 20 tablet 0   Multiple Vitamin (MULTI-VITAMIN) tablet Take 1 tablet by mouth daily.     No facility-administered medications prior to visit.    No Known Allergies  Review of Systems  Gastrointestinal:  Positive for nausea (occasional).  Psychiatric/Behavioral:  Positive for depression. The patient is nervous/anxious.        Objective:    Physical Exam Constitutional:      General: She is not in acute distress.    Appearance: Normal appearance. She is not ill-appearing.  HENT:     Head: Normocephalic and atraumatic.     Right Ear: External ear normal.     Left Ear: External ear normal.  Eyes:     Extraocular Movements: Extraocular movements intact.     Pupils: Pupils are equal, round, and reactive to light.  Cardiovascular:     Rate and Rhythm: Normal rate and regular rhythm.     Heart sounds: Normal heart sounds. No murmur heard.    No gallop.  Pulmonary:     Effort: Pulmonary effort is normal. No respiratory distress.     Breath sounds: Normal breath sounds. No wheezing or rales.  Skin:    General: Skin is warm and dry.  Neurological:     Mental Status: She is alert and oriented to person, place, and time.  Psychiatric:        Judgment: Judgment normal.     BP 134/87   Pulse 88   Resp 18   Ht 5\' 6"  (1.676 m)   Wt 177 lb (80.3 kg)   SpO2 92%   BMI 28.57 kg/m  Wt Readings from Last 3 Encounters:  06/09/22 177 lb (80.3 kg)  05/24/22 180 lb (81.6 kg)  01/11/22 175 lb 3.2 oz (79.5 kg)       Assessment & Plan:  Essential hypertension Assessment & Plan: Bp is now at goal with the addition of hctz.  Continue same. Obtain follow up bmet.   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  I think that her symptoms are largely situational. I encouraged her to establish with a counselor.  I gave patient a pamphlet with information about scheduling with a counselor at [facility].      I, [facility], NP, personally  preformed the services described in this documentation.  All medical record entries made by the scribe were at my direction and in my presence.  I have reviewed the chart and discharge instructions (if applicable) and agree that the record reflects my personal performance and is accurate and complete. 06/09/2022   I,Shehryar Baig,acting as a Neurosurgeon for Lemont Fillers, NP.,have documented all relevant documentation on the behalf of Lemont Fillers, NP,as directed by  Lemont Fillers, NP while in the presence of Lemont Fillers, NP.   Lemont Fillers, NP

## 2022-06-09 NOTE — Assessment & Plan Note (Signed)
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  I think that her symptoms are largely situational. I encouraged her to establish with a counselor.  I gave patient a pamphlet with information about scheduling with a counselor at [facility].

## 2022-07-04 IMAGING — US US MFM FETAL BPP W/O NON-STRESS
1 series · 12 of 28 positions shown · non-contrast
Comparison: none

[Series 1: us mfm fetal bpp w/o non-stress · 39 acquisitions, 12 frames shown]
[im 2/39]
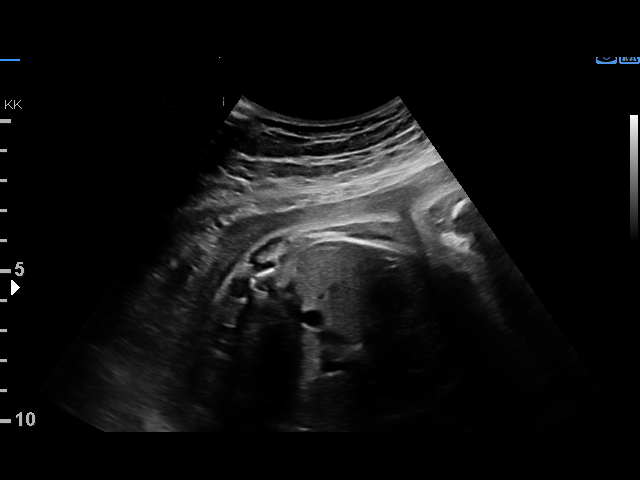
[im 5/39]
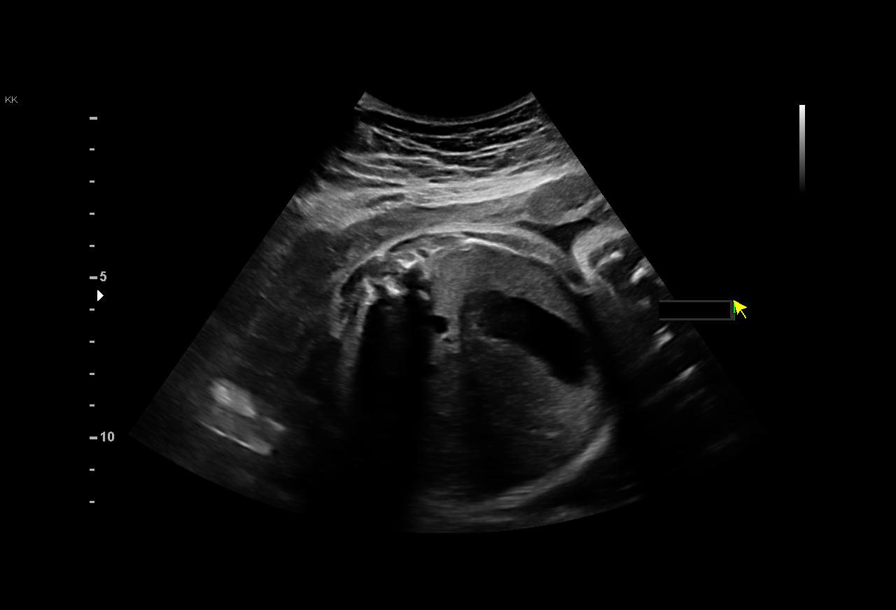
[im 8/39]
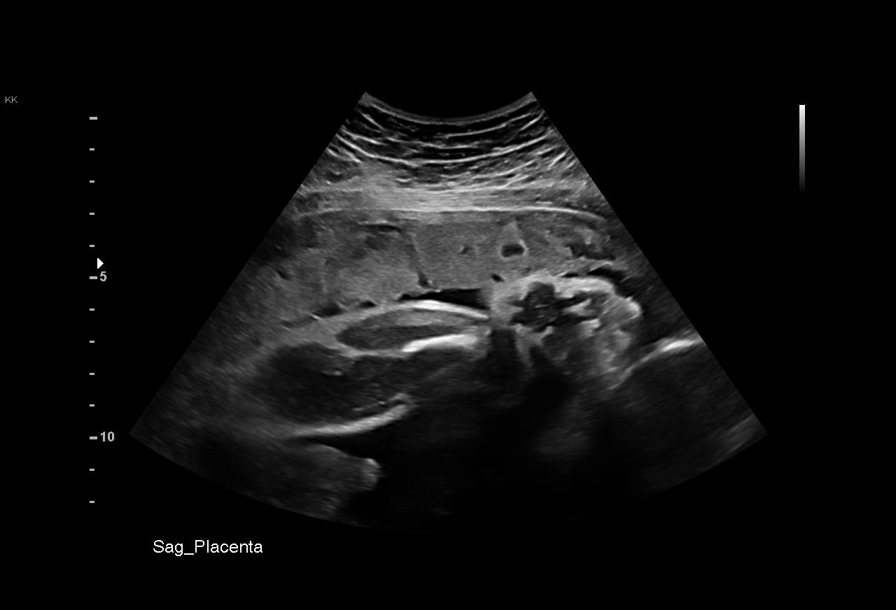
[im 12/39]
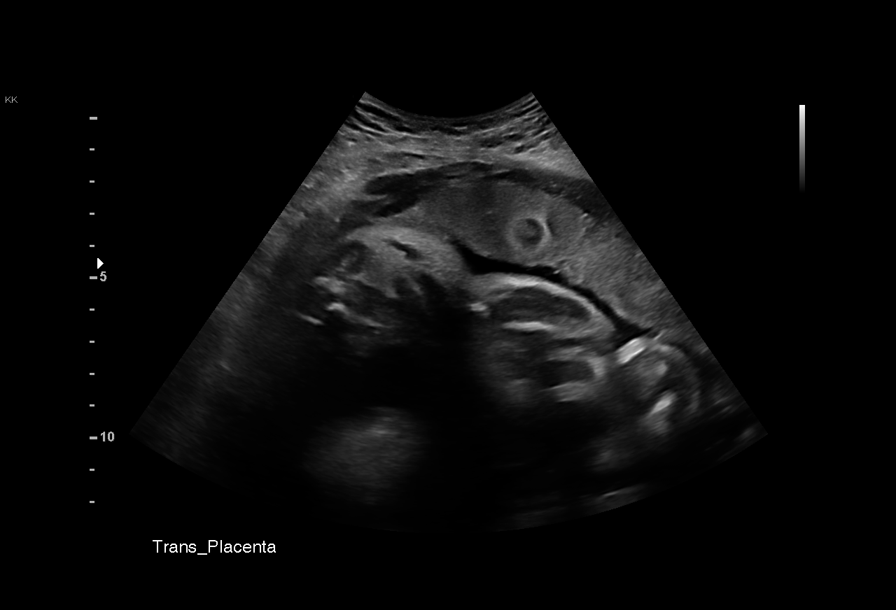
[im 15/39]
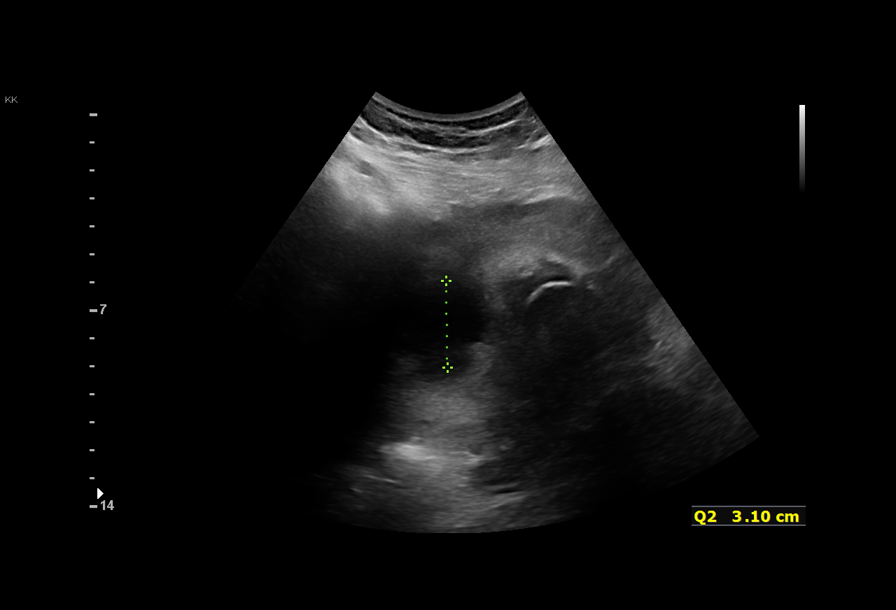
[im 17/39]
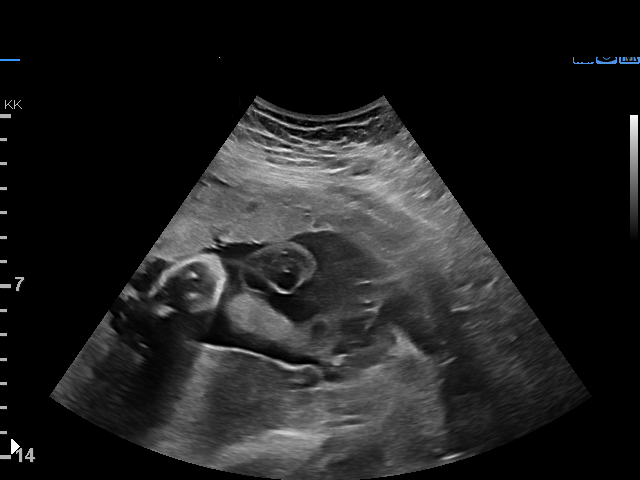
[im 22/39]
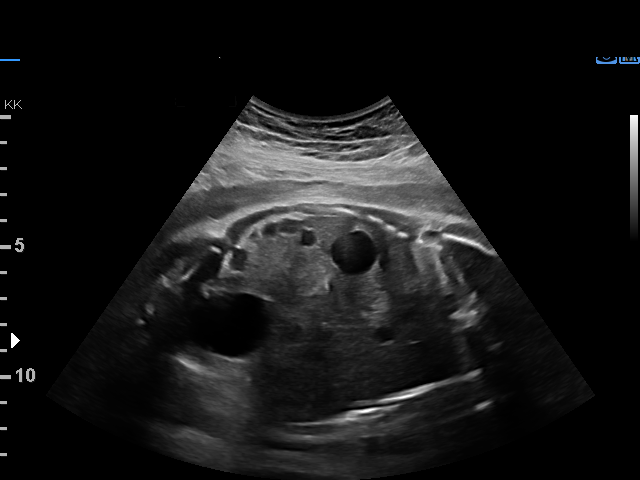
[im 24/39]
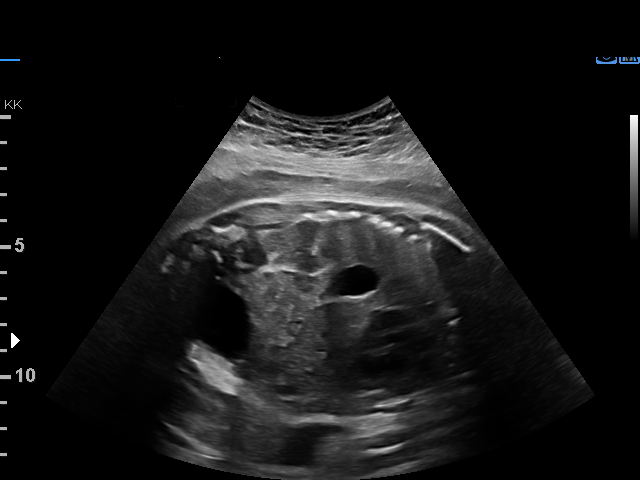
[im 27/39]
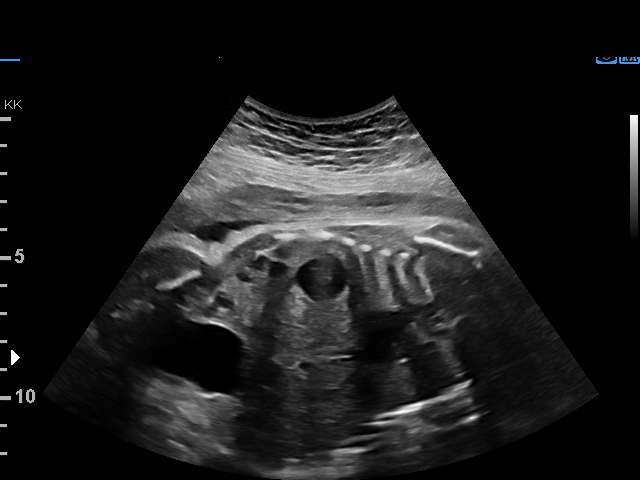
[im 31/39]
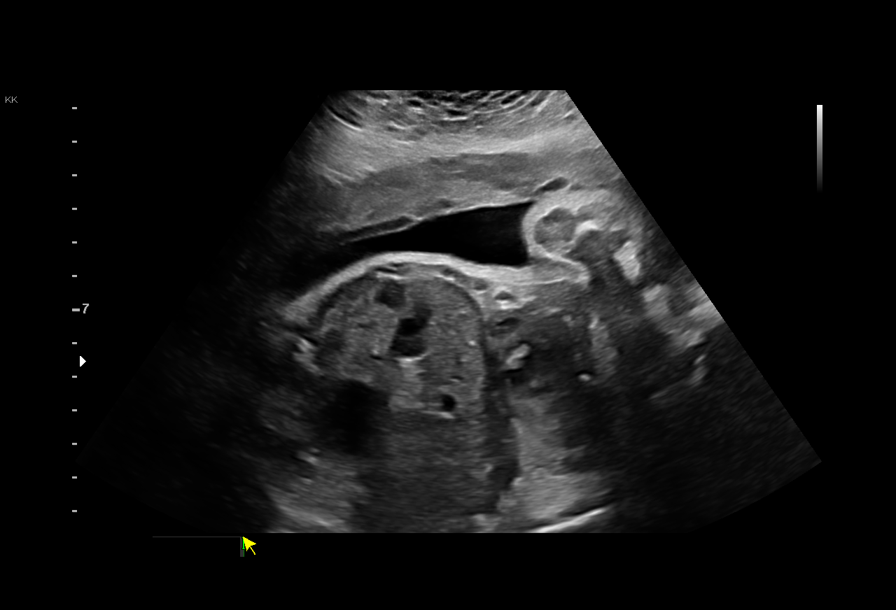
[im 34/39]
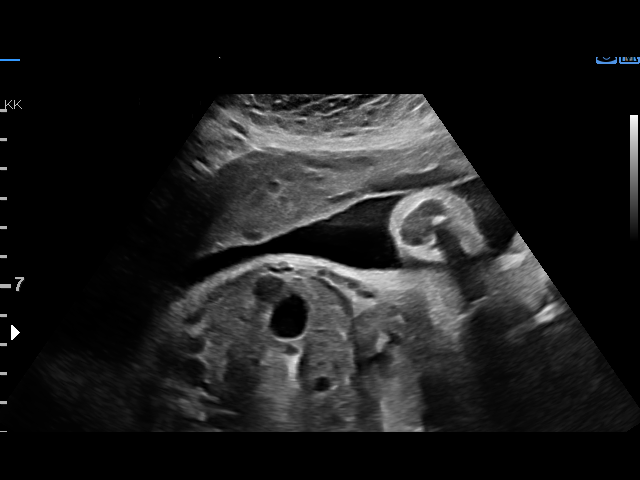
[im 37/39]
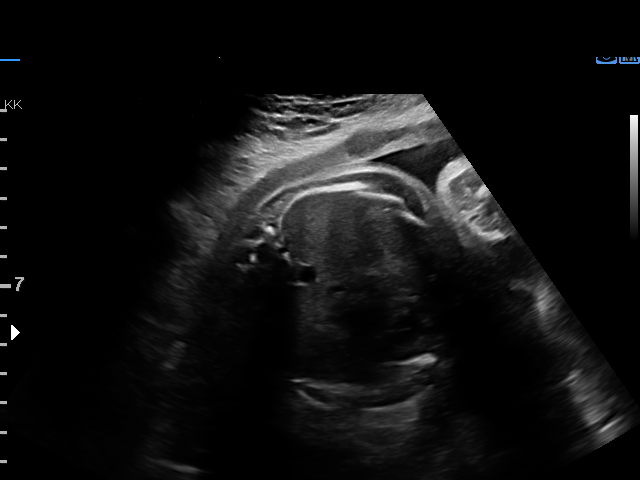

[12 of 28 positions shown; findings below may reference images not displayed]

Indications

 33 weeks gestation of pregnancy
 Abnormal biochemical screen (AFP 3.11)
 Pre-existing essential hypertension
 complicating pregnancy, third trimester-
 procardia
 Advanced maternal age multigravida 35+,
 third trimester (34 yrs)
Fetal Evaluation

 Num Of Fetuses:         1
 Fetal Heart Rate(bpm):  132
 Cardiac Activity:       Observed
 Presentation:           Cephalic
 Placenta:               Left lateral
 P. Cord Insertion:      Previously Visualized

 Amniotic Fluid
 AFI FV:      Within normal limits

 AFI Sum(cm)     %Tile       Largest Pocket(cm)
 12.5            37          4.

 RUQ(cm)       RLQ(cm)       LUQ(cm)        LLQ(cm)
 4             4
Biophysical Evaluation

 Amniotic F.V:   Pocket => 2 cm             F. Tone:        Observed
 F. Movement:    Observed                   Score:          [DATE]
 F. Breathing:   Observed
OB History

 Gravidity:    2         Term:   1
Gestational Age

 LMP:           33w 1d        Date:  02/07/20                 EDD:   11/13/20
 Best:          33w 1d     Det. By:  LMP  (02/07/20)          EDD:   11/13/20
Anatomy

 Diaphragm:             Appears normal         Bladder:                Appears normal
 Stomach:               Appears normal, left
                        sided
Impression

 Chronic hypertension. Well-controlled on nifedipine.

 BP at our office: 123/84 mm Hg .

 Amniotic fluid is normal and good fetal activity is seen
 .Antenatal testing is reassuring. BPP [DATE].
Recommendations

 -Continue weekly BPP till delivery.
                 [place], Lay

## 2022-07-11 ENCOUNTER — Other Ambulatory Visit: Payer: Self-pay | Admitting: Family Medicine

## 2022-07-11 DIAGNOSIS — I10 Essential (primary) hypertension: Secondary | ICD-10-CM

## 2022-07-12 ENCOUNTER — Other Ambulatory Visit: Payer: Self-pay | Admitting: Family

## 2022-07-12 ENCOUNTER — Other Ambulatory Visit (HOSPITAL_BASED_OUTPATIENT_CLINIC_OR_DEPARTMENT_OTHER): Payer: Self-pay

## 2022-07-12 DIAGNOSIS — I10 Essential (primary) hypertension: Secondary | ICD-10-CM

## 2022-07-12 MED ORDER — AMLODIPINE BESYLATE 10 MG PO TABS
10.0000 mg | ORAL_TABLET | Freq: Every day | ORAL | 0 refills | Status: DC
  Filled 2022-07-12: qty 30, 30d supply, fill #0

## 2022-07-12 MED ORDER — AMLODIPINE BESYLATE 10 MG PO TABS
10.0000 mg | ORAL_TABLET | Freq: Every day | ORAL | 0 refills | Status: DC
  Filled 2022-07-12: qty 90, 90d supply, fill #0

## 2022-07-12 MED ORDER — HYDROCHLOROTHIAZIDE 25 MG PO TABS
25.0000 mg | ORAL_TABLET | Freq: Every day | ORAL | 0 refills | Status: DC
  Filled 2022-07-12: qty 90, 90d supply, fill #0

## 2022-08-10 ENCOUNTER — Telehealth (INDEPENDENT_AMBULATORY_CARE_PROVIDER_SITE_OTHER): Payer: BC Managed Care – PPO | Admitting: Family

## 2022-08-10 DIAGNOSIS — J101 Influenza due to other identified influenza virus with other respiratory manifestations: Secondary | ICD-10-CM | POA: Diagnosis not present

## 2022-08-10 DIAGNOSIS — U071 COVID-19: Secondary | ICD-10-CM | POA: Diagnosis not present

## 2022-08-10 MED ORDER — OSELTAMIVIR PHOSPHATE 75 MG PO CAPS: 75.0000 mg | ORAL_CAPSULE | Freq: Two times a day (BID) | ORAL | 0 refills | Status: DC

## 2022-08-10 MED ORDER — PROMETHAZINE-DM 6.25-15 MG/5ML PO SYRP: 5.0000 mL | ORAL_SOLUTION | Freq: Four times a day (QID) | ORAL | 0 refills | Status: DC | PRN

## 2022-08-10 NOTE — Progress Notes (Signed)
MyChart Video Visit    Virtual Visit via Video Note   This visit type was conducted due to national recommendations for restrictions regarding the COVID-19 Pandemic (e.g. social distancing) in an effort to limit this patient's exposure and mitigate transmission in our community. This patient is at least at moderate risk for complications without adequate follow up. This format is felt to be most appropriate for this patient at this time. Physical exam was limited by quality of the video and audio technology used for the visit. CMA was able to get the patient set up on a video visit.  Patient location: Home. Patient and provider in visit Provider location: Office  I discussed the limitations of evaluation and management by telemedicine and the availability of in person appointments. The patient expressed understanding and agreed to proceed.  Visit Date: 08/10/2022  Today's healthcare provider: [name], NP     Subjective:    Patient ID: Angela Pollard, female    DOB: 1985/07/11, 37 y.o.   MRN: [id number]  Chief Complaint  Patient presents with   Covid Positive    Tested positive yesterday   Influenza    Tested positive yesterday   Cough    Complains of persistent productive cough   Generalized Body Aches    Complains of body aches    HPI  Past Medical History:  Diagnosis Date   Hyperglycemia    Hypertension    x 4-5 years    Past Surgical History:  Procedure Laterality Date   CESAREAN SECTION  09/2020   NO PAST SURGERIES      Family History  Problem Relation Age of Onset   Brain cancer Mother 53   Cancer Mother 24       brain   Kidney failure Father    CVA Father    Hypertension Father    Diabetes Mellitus II Father    Hyperlipidemia Father    Diabetes Mellitus II Half-Sister    Heart failure Maternal Aunt    Diabetes Neg Hx     Social History   Socioeconomic History   Marital status: Divorced    Spouse name: Not on file    Number of children: 1   Years of education: Not on file   Highest education level: Bachelor's degree (e.g., BA, AB, BS)  Occupational History   Not on file  Tobacco Use   Smoking status: Former    Packs/day: 0.30    Years: 8.00    Total pack years: 2.40    Types: Cigarettes   Smokeless tobacco: Never  Vaping Use   Vaping Use: Never used  Substance and Sexual Activity   Alcohol use: Not Currently    Comment: occasional   Drug use: Never   Sexual activity: Yes    Partners: Male    Birth control/protection: I.U.D.  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[employer], pt is a Marine scientist   Not married   Working on [employer] degree online   Has a son born 2008, son born 2022   Lives with significant other and son   Enjoys spending time with family, spending time with family, hiking   Social Determinants of Health   Financial Resource Strain: Not on file  Food Insecurity: Not on file  Transportation Needs: Not on file  Physical Activity: Not on file  Stress: Not on file  Social Connections: Not on file  Intimate Partner  Violence: Not on file    Outpatient Medications Prior to Visit  Medication Sig Dispense Refill   acetaminophen (TYLENOL) 325 MG tablet Take 650 mg by mouth every 6 (six) hours as needed.     amLODipine (NORVASC) 10 MG tablet Take 1 tablet (10 mg total) by mouth daily. 90 tablet 0   hydrochlorothiazide (HYDRODIURIL) 25 MG tablet Take 1 tablet (25 mg total) by mouth daily. 90 tablet 0   influenza vac split quadrivalent PF (FLUARIX QUADRIVALENT) 0.5 ML injection Inject into the muscle. 0.5 mL 0   levonorgestrel (LILETTA, 52 MG,) 20.1 MCG/DAY IUD 1 each by Intrauterine route once.     methocarbamol (ROBAXIN) 500 MG tablet Take 1 tablet (500 mg total) by mouth every 8 (eight) hours as needed for muscle spasms. 20 tablet 0   Multiple Vitamin (MULTI-VITAMIN) tablet Take 1 tablet by mouth daily.     benzonatate (TESSALON) 100 MG capsule Take  1 capsule (100 mg total) by mouth 3 (three) times daily as needed. 20 capsule 0   No facility-administered medications prior to visit.    No Known Allergies  ROS     Objective:    Physical Exam  There were no vitals taken for this visit. Wt Readings from Last 3 Encounters:  06/09/22 177 lb (80.3 kg)  05/24/22 180 lb (81.6 kg)  01/11/22 175 lb 3.2 oz (79.5 kg)       Assessment & Plan:   Problem List Items Addressed This Visit   None   I have discontinued Yee Livers's benzonatate. I am also having her maintain her acetaminophen, Multi-Vitamin, Liletta (52 MG), Fluarix Quadrivalent, methocarbamol, amLODipine, and hydrochlorothiazide.  No orders of the defined types were placed in this encounter.   I discussed the assessment and treatment plan with the patient. The patient was provided an opportunity to ask questions and all were answered. The patient agreed with the plan and demonstrated an understanding of the instructions.   The patient was advised to call back or seek an in-person evaluation if the symptoms worsen or if the condition fails to improve as anticipated.  [name], NP [facility] [place] (phone) [phone] (fax)  [place]

## 2022-08-10 NOTE — Assessment & Plan Note (Addendum)
New. She has been vaccinated against flu this season. Will rx with tamiflu and promethazine DM cough syrup.

## 2022-08-10 NOTE — Assessment & Plan Note (Signed)
Advised of CDC guidelines for self isolation/ ending isolation.  Advised of safe practice guidelines. Symptom Tier reviewed.  Encouraged to monitor for any worsening symptoms; watch for increased shortness of breath, weakness, and signs of dehydration. Advised when to seek emergency care.  Instructed to rest and hydrate well.  Advised to leave the house during recommended isolation period, only if it is necessary to seek medical care

## 2022-08-10 NOTE — Progress Notes (Signed)
MyChart Video Visit    Virtual Visit via Video Note   This visit type was conducted due to national recommendations for restrictions regarding the COVID-19 Pandemic (e.g. social distancing) in an effort to limit this patient's exposure and mitigate transmission in our community. This patient is at least at moderate risk for complications without adequate follow up. This format is felt to be most appropriate for this patient at this time. Physical exam was limited by quality of the video and audio technology used for the visit. CMA was able to get the patient set up on a video visit.  Patient location: Home Patient and provider in visit Provider location: Office  I discussed the limitations of evaluation and management by telemedicine and the availability of in person appointments. The patient expressed understanding and agreed to proceed.  Visit Date: 08/10/2022  Today's healthcare provider: [name], NP     Subjective:    Patient ID: Angela Pollard, female    DOB: 1986-01-29, 37 y.o.   MRN: [id number]  Chief Complaint  Patient presents with   Covid Positive    Tested positive yesterday   Influenza    Tested positive yesterday   Cough    Complains of persistent productive cough   Generalized Body Aches    Complains of body aches    Influenza Associated symptoms include congestion, coughing, headaches and myalgias. Pertinent negatives include no fever.  Cough Associated symptoms include headaches and myalgias. Pertinent negatives include no fever.   Patient is in today for a video visit.   She complains of cough since Saturday 08/07/2022. She later developed body aches, congestion, white sputum production, and headaches. She has mild shortness of breath with exertion and is attributing that with her cough. She is interested in taking cough medication to help manage it. She denies having any fevers. She tested positive for Covid-19 and Flu B the next day on  08/08/2022. She has taking day time OTC flu medication.    Past Medical History:  Diagnosis Date   Hyperglycemia    Hypertension    x 4-5 years    Past Surgical History:  Procedure Laterality Date   CESAREAN SECTION  09/2020   NO PAST SURGERIES      Family History  Problem Relation Age of Onset   Brain cancer Mother 21   Cancer Mother 55       brain   Kidney failure Father    CVA Father    Hypertension Father    Diabetes Mellitus II Father    Hyperlipidemia Father    Diabetes Mellitus II Half-Sister    Heart failure Maternal Aunt    Diabetes Neg Hx     Social History   Socioeconomic History   Marital status: Divorced    Spouse name: Not on file   Number of children: 1   Years of education: Not on file   Highest education level: Bachelor's degree (e.g., BA, AB, BS)  Occupational History   Not on file  Tobacco Use   Smoking status: Former    Packs/day: 0.30    Years: 8.00    Total pack years: 2.40    Types: Cigarettes   Smokeless tobacco: Never  Vaping Use   Vaping Use: Never used  Substance and Sexual Activity   Alcohol use: Not Currently    Comment: occasional   Drug use: Never   Sexual activity: Yes    Partners: Male    Birth control/protection: I.U.D.  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[employer], pt is a Marine scientist   Not married   Working on [employer] degree online   Has a son born 2008, son born 2022   Lives with significant other and son   Enjoys spending time with family, spending time with family, hiking   Social Determinants of Health   Financial Resource Strain: Not on file  Food Insecurity: Not on file  Transportation Needs: Not on file  Physical Activity: Not on file  Stress: Not on file  Social Connections: Not on file  Intimate Partner Violence: Not on file    Outpatient Medications Prior to Visit  Medication Sig Dispense Refill   acetaminophen (TYLENOL) 325 MG tablet Take 650 mg by mouth every  6 (six) hours as needed.     amLODipine (NORVASC) 10 MG tablet Take 1 tablet (10 mg total) by mouth daily. 90 tablet 0   hydrochlorothiazide (HYDRODIURIL) 25 MG tablet Take 1 tablet (25 mg total) by mouth daily. 90 tablet 0   influenza vac split quadrivalent PF (FLUARIX QUADRIVALENT) 0.5 ML injection Inject into the muscle. 0.5 mL 0   levonorgestrel (LILETTA, 52 MG,) 20.1 MCG/DAY IUD 1 each by Intrauterine route once.     methocarbamol (ROBAXIN) 500 MG tablet Take 1 tablet (500 mg total) by mouth every 8 (eight) hours as needed for muscle spasms. 20 tablet 0   Multiple Vitamin (MULTI-VITAMIN) tablet Take 1 tablet by mouth daily.     benzonatate (TESSALON) 100 MG capsule Take 1 capsule (100 mg total) by mouth 3 (three) times daily as needed. 20 capsule 0   No facility-administered medications prior to visit.    No Known Allergies  Review of Systems  Constitutional:  Negative for fever.  HENT:  Positive for congestion.   Respiratory:  Positive for cough and sputum production (white).   Musculoskeletal:  Positive for myalgias.  Neurological:  Positive for headaches.       Objective:    Physical Exam  There were no vitals taken for this visit. Wt Readings from Last 3 Encounters:  06/09/22 177 lb (80.3 kg)  05/24/22 180 lb (81.6 kg)  01/11/22 175 lb 3.2 oz (79.5 kg)    Gen: Awake, alert, no acute distress Resp: Breathing is even and non-labored Psych: calm/pleasant demeanor Neuro: Alert and Oriented x 3, + facial symmetry, speech is clear.     Assessment & Plan:  Influenza B Assessment & Plan: New. She has been vaccinated against flu this season. Will rx with tamiflu and promethazine DM cough syrup.   COVID-19 virus infection Assessment & Plan: Advised of CDC guidelines for self isolation/ ending isolation.  Advised of safe practice guidelines. Symptom Tier reviewed.  Encouraged to monitor for any worsening symptoms; watch for increased shortness of breath, weakness, and  signs of dehydration. Advised when to seek emergency care.  Instructed to rest and hydrate well.  Advised to leave the house during recommended isolation period, only if it is necessary to seek medical care    Other orders -     Oseltamivir Phosphate; Take 1 capsule (75 mg total) by mouth 2 (two) times daily.  Dispense: 10 capsule; Refill: 0 -     Promethazine-DM; Take 5 mLs by mouth 4 (four) times daily as needed for cough.  Dispense: 180 mL; Refill: 0     I discussed the assessment and treatment plan with the patient. The patient was provided  an opportunity to ask questions and all were answered. The patient agreed with the plan and demonstrated an understanding of the instructions.   The patient was advised to call back or seek an in-person evaluation if the symptoms worsen or if the condition fails to improve as anticipated.  [name], NP [facility] [place] (phone) [phone] (fax)  [place]    I,Shehryar Baig,acting as a scribe for [name], NP.,have documented all relevant documentation on the behalf of [name], NP,as directed by  [name], NP while in the presence of [name], NP.

## 2022-08-11 ENCOUNTER — Ambulatory Visit (INDEPENDENT_AMBULATORY_CARE_PROVIDER_SITE_OTHER): Payer: BC Managed Care – PPO | Admitting: Behavioral Health

## 2022-08-11 DIAGNOSIS — F329 Major depressive disorder, single episode, unspecified: Secondary | ICD-10-CM | POA: Diagnosis not present

## 2022-08-11 DIAGNOSIS — F419 Anxiety disorder, unspecified: Secondary | ICD-10-CM | POA: Diagnosis not present

## 2022-08-11 NOTE — Progress Notes (Signed)
[facility] Counselor Initial Adult Exam  Name: Angela Pollard Date: 08/11/2022 MRN: [id number] DOB: 1985-09-28 PCP: [name], NP  Time spent: 60 min Caregility video; Pt is home in private & Provider working @ [facility] [place]:  Self    Paperwork requested: No   Reason for Visit /Presenting Problem: Elevated anx/dep noted by Pt lately  Mental Status Exam: Appearance:   Casual     Behavior:  Appropriate and Sharing  Motor:  Normal  Speech/Language:   Clear and Coherent  Affect:  Appropriate  Mood:  normal  Thought process:  normal  Thought content:    WNL  Sensory/Perceptual disturbances:    WNL  Orientation:  oriented to person, place, and time/date  Attention:  Good  Concentration:  Good  Memory:  WNL  Fund of knowledge:   Good  Insight:    Good  Judgment:   Good  Impulse Control:  Good    Risk Assessment: Danger to Self:  No Self-injurious Behavior: No Danger to Others: No Duty to Warn:no Physical Aggression / Violence:No  Access to Firearms a concern: No  Gang Involvement:No  Patient / guardian was educated about steps to take if suicide or homicide risk level increases between visits: yes While future psychiatric events cannot be accurately predicted, the patient does not currently require acute inpatient psychiatric care and does not currently meet [facility] involuntary commitment criteria.  Substance Abuse History: Current substance abuse: No     Past Psychiatric History:   No previous psychological problems have been observed; Pt has prior exp w/her anx/dep being difficult to control Outpatient Providers: [name], NP History of Psych Hospitalization: No  Psychological Testing:  NA    Abuse History:  Victim of: No.,  NA    Report needed: No. Victim of Neglect:No. Perpetrator of  NA   Witness / Exposure to Domestic Violence: No   Protective Services Involvement: No  Witness to [employer]  Violence:  No   Family History:  Family History  Problem Relation Age of Onset   Brain cancer Mother 68   Cancer Mother 55       brain   Kidney failure Father    CVA Father    Hypertension Father    Diabetes Mellitus II Father    Hyperlipidemia Father    Diabetes Mellitus II Half-Sister    Heart failure Maternal Aunt    Diabetes Neg Hx     Living situation: the patient lives with their family  Sexual Orientation: Straight  Relationship Status: married  Name of spouse / other: Husb Sonia Side If a parent, number of children / ages:15yo [name] who lives w/his Oncologist Dad Jeneen Rinks in [place]. She gets her Son on wknds. 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 History: Education:  Pt is working on her Brewing technologist of Nsg thru [employer] online that is self-driven  Religion/Sprituality/World View: [place] upbringing & Jerry's Family fllws Buddhism tenets; fllw'g the values of doing good  Any cultural differences that may affect / interfere with treatment:  None noted today  Recreation/Hobbies: reading for fun!  Stressors: Educational concerns   Health  problems   Traumatic event    Strengths: Supportive Relationships, Family, Friends, Spirituality, Conservator, museum/gallery, and Able to Communicate Effectively  Barriers:  None noted today   Legal History: Pending legal issue / charges: The patient has no significant history of legal issues. History of legal issue / charges:  NA  Medical History/Surgical History: reviewed Past Medical History:  Diagnosis Date   Hyperglycemia    Hypertension    x 4-5 years    Past Surgical History:  Procedure Laterality Date    CESAREAN SECTION  09/2020   NO PAST SURGERIES      Medications: Current Outpatient Medications  Medication Sig Dispense Refill   acetaminophen (TYLENOL) 325 MG tablet Take 650 mg by mouth every 6 (six) hours as needed.     amLODipine (NORVASC) 10 MG tablet Take 1 tablet (10 mg total) by mouth daily. 90 tablet 0   hydrochlorothiazide (HYDRODIURIL) 25 MG tablet Take 1 tablet (25 mg total) by mouth daily. 90 tablet 0   influenza vac split quadrivalent PF (FLUARIX QUADRIVALENT) 0.5 ML injection Inject into the muscle. 0.5 mL 0   levonorgestrel (LILETTA, 52 MG,) 20.1 MCG/DAY IUD 1 each by Intrauterine route once.     methocarbamol (ROBAXIN) 500 MG tablet Take 1 tablet (500 mg total) by mouth every 8 (eight) hours as needed for muscle spasms. 20 tablet 0   Multiple Vitamin (MULTI-VITAMIN) tablet Take 1 tablet by mouth daily.     oseltamivir (TAMIFLU) 75 MG capsule Take 1 capsule (75 mg total) by mouth 2 (two) times daily. 10 capsule 0   promethazine-dextromethorphan (PROMETHAZINE-DM) 6.25-15 MG/5ML syrup Take 5 mLs by mouth 4 (four) times daily as needed for cough. 180 mL 0   No current facility-administered medications for this visit.    No Known Allergies  Diagnoses:  Anxiety  Reactive depression  Plan of Care: Nadja will attend all scheduled sessions every 2-3 wks. She will keep a Notebook btwn sessions to record her thoughts & concerns & bring this to sessions to keep our work focused.   Target Date: 11/09/2022  Progress:2  Frequency: Twice monthly  Modality: Boykin Reaper, LMFT

## 2022-08-11 NOTE — Progress Notes (Signed)
                Sonakshi Rolland L Wren Gallaga, LMFT 

## 2022-08-25 ENCOUNTER — Ambulatory Visit: Payer: BC Managed Care – PPO | Admitting: Behavioral Health

## 2022-08-25 DIAGNOSIS — F329 Major depressive disorder, single episode, unspecified: Secondary | ICD-10-CM | POA: Diagnosis not present

## 2022-08-25 DIAGNOSIS — F419 Anxiety disorder, unspecified: Secondary | ICD-10-CM

## 2022-08-25 NOTE — Progress Notes (Signed)
[facility] Counselor/Therapist Progress Note  Patient ID: Angela Pollard, MRN: [id number],    Date: 08/25/2022  Time Spent: 23 min Caregility; Pt is home in private & Provider is working remote @ [place] [employer]   Treatment Type: Individual Therapy  Reported Symptoms: Pt is feeling "level" today, although the weather has caused some low moods. Pt is getting over feeling sick a few wks ago; her cough is hanging on & her Son is sick.   Mental Status Exam: Appearance:  Casual     Behavior: Appropriate and Sharing  Motor: Normal  Speech/Language:  Clear and Coherent  Affect: Appropriate  Mood: normal  Thought process: normal  Thought content:   WNL  Sensory/Perceptual disturbances:   WNL  Orientation: oriented to person, place, and time/date  Attention: Good  Concentration: Good  Memory: WNL  Fund of knowledge:  Good  Insight:   Good  Judgment:  Good  Impulse Control: Good   Risk Assessment: Danger to Self:  No Self-injurious Behavior: No Danger to Others: No Duty to Warn:no Physical Aggression / Violence:No  Access to Firearms a concern: No  Gang Involvement:No   Subjective: Pt is trying to manage her Son's illness today. She did not realize until this morning how sick he is & she secured a Pediatric visit for him.   She is worried for her concerns w/her Graduate Sch track. She has mixed emotions; it has been such a unique journey.   Pt is wanting to possibly have her older Son Angela Pollard join her in a session & we also discussed the possibility of Cpl Th.   Interventions: Solution-Oriented/Positive Psychology  Diagnosis:Anxiety  Reactive depression  Plan: Angela Pollard cont's to manage a very busy home & career life. She is annually bothered by low moods during the winter months. She is finding the conclusion of her studies to be filled w/emotions. It gets overwhelming & also is rewarding to be working in her chosen field & having great experiences w/her  Radiation protection practitioner.   Pt is slowly recounting her early childhood exp's. She knows they have impacted her in many ways & wants to understand herself better. Angela Pollard will cont to take her mental notes & care for herself in psychotherapy. Target Date: 09/23/2022 Progress: 6 Frequency: Once every 3 wks Modality: Angela Reaper, LMFT

## 2022-08-25 NOTE — Progress Notes (Signed)
                Swanson Farnell L Karey Suthers, LMFT 

## 2022-08-27 ENCOUNTER — Other Ambulatory Visit (HOSPITAL_BASED_OUTPATIENT_CLINIC_OR_DEPARTMENT_OTHER): Payer: Self-pay

## 2022-08-27 ENCOUNTER — Telehealth (INDEPENDENT_AMBULATORY_CARE_PROVIDER_SITE_OTHER): Payer: BC Managed Care – PPO | Admitting: Family

## 2022-08-27 DIAGNOSIS — I10 Essential (primary) hypertension: Secondary | ICD-10-CM | POA: Diagnosis not present

## 2022-08-27 DIAGNOSIS — R739 Hyperglycemia, unspecified: Secondary | ICD-10-CM

## 2022-08-27 DIAGNOSIS — J40 Bronchitis, not specified as acute or chronic: Secondary | ICD-10-CM

## 2022-08-27 MED ORDER — AZITHROMYCIN 250 MG PO TABS
ORAL_TABLET | ORAL | 0 refills | Status: AC
  Filled 2022-08-27: qty 6, 5d supply, fill #0

## 2022-08-27 NOTE — Assessment & Plan Note (Signed)
She felt dried out on hctz '25mg'$ .  Reports bp 120/90 today. Recommended that she restart hctz '25mg'$  at 1/2 tab once daily.

## 2022-08-27 NOTE — Assessment & Plan Note (Signed)
New. She will schedule lab visit for A1C.

## 2022-08-27 NOTE — Assessment & Plan Note (Signed)
New. Will rx with azithromycin. If symptoms fail to improve will need CXR.

## 2022-08-27 NOTE — Progress Notes (Signed)
MyChart Video Visit    Virtual Visit via Video Note     Patient location: Patient at work and Provider at office Provider location: Office  I discussed the limitations of evaluation and management by telemedicine and the availability of in person appointments. The patient expressed understanding and agreed to proceed.  Visit Date: 08/27/2022  Today's healthcare provider: [name], NP     Subjective:    Patient ID: Angela Pollard, female    DOB: 1985-11-07, 37 y.o.   MRN: [id number]  Chief Complaint  Patient presents with   Cough    Complains of cough coming back after positive covid and flu 2 weeks ago.     Cough Associated symptoms include shortness of breath. Pertinent negatives include no fever.   Patient is in today for a video visit.   Cough: She complains of productive cough. It is worse at night. She is coughing up thick yellow mucous. She also gets shortness of breathe but denies wheezing. She recently recovered from flu and Covid-19 and reports her cough started a couple days after recovering. She denies having any fever.   Blood pressure: She is requesting to stop taking 25 mg hydrochlorothiazide due to feeling dried out while taking it. She stopped taking it for a period and continued her other medication and reports her blood pressure was measuring stable. She continues taking 10 mg amlodipine daily PO.  BP Readings from Last 3 Encounters:  06/09/22 134/87  05/24/22 (!) 142/95  01/11/22 (!) 136/93   Pulse Readings from Last 3 Encounters:  06/09/22 88  05/24/22 65  01/11/22 73    Past Medical History:  Diagnosis Date   Hyperglycemia    Hypertension    x 4-5 years    Past Surgical History:  Procedure Laterality Date   CESAREAN SECTION  09/2020   NO PAST SURGERIES      Family History  Problem Relation Age of Onset   Brain cancer Mother 72   Cancer Mother 19       brain   Kidney failure Father    CVA Father    Hypertension  Father    Diabetes Mellitus II Father    Hyperlipidemia Father    Diabetes Mellitus II Half-Sister    Heart failure Maternal Aunt    Diabetes Neg Hx     Social History   Socioeconomic History   Marital status: Divorced    Spouse name: Not on file   Number of children: 1   Years of education: Not on file   Highest education level: Bachelor's degree (e.g., BA, AB, BS)  Occupational History   Not on file  Tobacco Use   Smoking status: Former    Packs/day: 0.30    Years: 8.00    Total pack years: 2.40    Types: Cigarettes   Smokeless tobacco: Never  Vaping Use   Vaping Use: Never used  Substance and Sexual Activity   Alcohol use: Not Currently    Comment: occasional   Drug use: Never   Sexual activity: Yes    Partners: Male    Birth control/protection: I.U.D.  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[employer], pt is a Marine scientist   Not married   Working on [employer] degree online   Has a son born 2008, son born 2022   Lives with significant other and son   Enjoys spending time with family, spending time  with family, hiking   Social Determinants of Health   Financial Resource Strain: Not on file  Food Insecurity: Not on file  Transportation Needs: Not on file  Physical Activity: Not on file  Stress: Not on file  Social Connections: Not on file  Intimate Partner Violence: Not on file    Outpatient Medications Prior to Visit  Medication Sig Dispense Refill   acetaminophen (TYLENOL) 325 MG tablet Take 650 mg by mouth every 6 (six) hours as needed.     amLODipine (NORVASC) 10 MG tablet Take 1 tablet (10 mg total) by mouth daily. 90 tablet 0   influenza vac split quadrivalent PF (FLUARIX QUADRIVALENT) 0.5 ML injection Inject into the muscle. 0.5 mL 0   levonorgestrel (LILETTA, 52 MG,) 20.1 MCG/DAY IUD 1 each by Intrauterine route once.     methocarbamol (ROBAXIN) 500 MG tablet Take 1 tablet (500 mg total) by mouth every 8 (eight) hours as  needed for muscle spasms. 20 tablet 0   promethazine-dextromethorphan (PROMETHAZINE-DM) 6.25-15 MG/5ML syrup Take 5 mLs by mouth 4 (four) times daily as needed for cough. 180 mL 0   hydrochlorothiazide (HYDRODIURIL) 25 MG tablet Take 1 tablet (25 mg total) by mouth daily. 90 tablet 0   Multiple Vitamin (MULTI-VITAMIN) tablet Take 1 tablet by mouth daily.     oseltamivir (TAMIFLU) 75 MG capsule Take 1 capsule (75 mg total) by mouth 2 (two) times daily. 10 capsule 0   No facility-administered medications prior to visit.    No Known Allergies  Review of Systems  Constitutional:  Negative for fever.  Respiratory:  Positive for cough (productive cough), sputum production (thick yellow) and shortness of breath.        Objective:    Physical Exam  There were no vitals taken for this visit. Wt Readings from Last 3 Encounters:  06/09/22 177 lb (80.3 kg)  05/24/22 180 lb (81.6 kg)  01/11/22 175 lb 3.2 oz (79.5 kg)    Gen: Awake, alert, no acute distress Resp: Breathing is even and non-labored Psych: calm/pleasant demeanor Neuro: Alert and Oriented x 3, + facial symmetry, speech is clear.     Assessment & Plan:  Hyperglycemia Assessment & Plan: New. She will schedule lab visit for A1C.   Orders: -     Hemoglobin A1c; Future -     Lipid panel; Future  Bronchitis Assessment & Plan: New. Will rx with azithromycin. If symptoms fail to improve will need CXR.   Orders: -     Azithromycin; Take 2 tablets on day 1, then 1 tablet daily on days 2 through 5  Dispense: 6 tablet; Refill: 0  Essential hypertension Assessment & Plan: She felt dried out on hctz '25mg'$ .  Reports bp 120/90 today. Recommended that she restart hctz '25mg'$  at 1/2 tab once daily.      I discussed the assessment and treatment plan with the patient. The patient was provided an opportunity to ask questions and all were answered. The patient agreed with the plan and demonstrated an understanding of the  instructions.   The patient was advised to call back or seek an in-person evaluation if the symptoms worsen or if the condition fails to improve as anticipated.  [name], NP [facility] [place] (phone) [phone] (fax)  [place]    I,Shehryar Baig,acting as a scribe for [name], NP.,have documented all relevant documentation on the behalf of [name], NP,as directed by  [name], NP while in the presence of [name], NP.

## 2022-09-01 ENCOUNTER — Other Ambulatory Visit (INDEPENDENT_AMBULATORY_CARE_PROVIDER_SITE_OTHER): Payer: BC Managed Care – PPO

## 2022-09-01 DIAGNOSIS — R739 Hyperglycemia, unspecified: Secondary | ICD-10-CM

## 2022-09-01 LAB — LIPID PANEL
Cholesterol: 207 mg/dL — ABNORMAL HIGH (ref 0–200)
HDL: 41.2 mg/dL (ref >39.00)
LDL Cholesterol: 147 mg/dL — ABNORMAL HIGH (ref 0–99)
NonHDL: 166.03
Total CHOL/HDL Ratio: 5
Triglycerides: 94 mg/dL (ref 0.0–149.0)
VLDL: 18.8 mg/dL (ref 0.0–40.0)

## 2022-09-01 LAB — HEMOGLOBIN A1C: Hgb A1c MFr Bld: 5.9 % (ref 4.6–6.5)

## 2022-09-08 ENCOUNTER — Encounter: Payer: Self-pay | Admitting: Family

## 2022-09-08 ENCOUNTER — Ambulatory Visit: Payer: BC Managed Care – PPO | Admitting: Family

## 2022-09-08 VITALS — BP 117/75 | HR 69 | Temp 97.9°F | Resp 16 | Ht 66.0 in | Wt 177.0 lb

## 2022-09-08 DIAGNOSIS — N6310 Unspecified lump in the right breast, unspecified quadrant: Secondary | ICD-10-CM

## 2022-09-08 DIAGNOSIS — I10 Essential (primary) hypertension: Secondary | ICD-10-CM | POA: Diagnosis not present

## 2022-09-08 DIAGNOSIS — M546 Pain in thoracic spine: Secondary | ICD-10-CM | POA: Diagnosis not present

## 2022-09-08 DIAGNOSIS — E663 Overweight: Secondary | ICD-10-CM

## 2022-09-08 DIAGNOSIS — J40 Bronchitis, not specified as acute or chronic: Secondary | ICD-10-CM

## 2022-09-08 DIAGNOSIS — R739 Hyperglycemia, unspecified: Secondary | ICD-10-CM | POA: Diagnosis not present

## 2022-09-08 NOTE — Progress Notes (Signed)
Subjective:   By signing my name below, I, Angela Pollard, attest that this documentation has been prepared under the direction and in the presence of Angela Alar, NP. 09/08/2022   Patient ID: Angela Pollard, female    DOB: [birth year], 37 y.o.   MRN: [id number]  Chief Complaint  Patient presents with   Hypertension    Here for follow up    HPI Patient is in today for a follow-up appointment.   Blood pressure: Her blood pressure is stable during this visit. She is not taking 25 mg hydrochlorothiazide regularly.The last time she took it was five days ago.  BP Readings from Last 3 Encounters:  09/08/22 117/75  06/09/22 134/87  05/24/22 (!) 142/95    Pulse Readings from Last 3 Encounters:  09/08/22 69  06/09/22 88  05/24/22 65   A1C: Her A1C is elevated.  Lab Results  Component Value Date   HGBA1C 5.9 09/01/2022    Back pain: Her lower back pain has subsided. She does not take 20.1 mcg robaxin.   Pap smear: Last pap smear completed on 01/02/2019. Results are normal.   Mood: She is attending counseling currently to manage her stress.   Exercise: She exercises consistently throughout the week.     Past Medical History:  Diagnosis Date   Hyperglycemia    Hypertension    x 4-5 years   Infertility, female 02/20/2020    Past Surgical History:  Procedure Laterality Date   CESAREAN SECTION  09/2020   NO PAST SURGERIES      Family History  Problem Relation Age of Onset   Brain cancer Mother 87   Cancer Mother 87       brain   Kidney failure Father    CVA Father    Hypertension Father    Diabetes Mellitus II Father    Hyperlipidemia Father    Diabetes Mellitus II Half-Sister    Heart failure Maternal Aunt    Diabetes Neg Hx     Social History   Socioeconomic History   Marital status: Divorced    Spouse name: Not on file   Number of children: 1   Years of education: Not on file   Highest education level: Bachelor's degree (e.g., BA, AB, BS)   Occupational History   Not on file  Tobacco Use   Smoking status: Former    Packs/day: 0.30    Years: 8.00    Total pack years: 2.40    Types: Cigarettes   Smokeless tobacco: Never  Vaping Use   Vaping Use: Never used  Substance and Sexual Activity   Alcohol use: Not Currently    Comment: occasional   Drug use: Never   Sexual activity: Yes    Partners: Male    Birth control/protection: I.U.D.  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[employer], pt is a Marine scientist   Not married   Working on [employer] degree online   Has a son born 2008, son born 2022   Lives with significant other and son   Enjoys spending time with family, spending time with family, hiking   Social Determinants of Health   Financial Resource Strain: Not on file  Food Insecurity: Not on file  Transportation Needs: Not on file  Physical Activity: Not on file  Stress: Not on file  Social Connections: Not on file  Intimate Partner Violence: Not on file    Outpatient Medications  Prior to Visit  Medication Sig Dispense Refill   acetaminophen (TYLENOL) 325 MG tablet Take 650 mg by mouth every 6 (six) hours as needed.     amLODipine (NORVASC) 10 MG tablet Take 1 tablet (10 mg total) by mouth daily. 90 tablet 0   levonorgestrel (LILETTA, 52 MG,) 20.1 MCG/DAY IUD 1 each by Intrauterine route once.     promethazine-dextromethorphan (PROMETHAZINE-DM) 6.25-15 MG/5ML syrup Take 5 mLs by mouth 4 (four) times daily as needed for cough. 180 mL 0   influenza vac split quadrivalent PF (FLUARIX QUADRIVALENT) 0.5 ML injection Inject into the muscle. 0.5 mL 0   methocarbamol (ROBAXIN) 500 MG tablet Take 1 tablet (500 mg total) by mouth every 8 (eight) hours as needed for muscle spasms. 20 tablet 0   hydrochlorothiazide (HYDRODIURIL) 25 MG tablet Take 1 tablet (25 mg total) by mouth daily. (Patient not taking: Reported on 09/08/2022) 90 tablet 0   No facility-administered medications prior to  visit.    No Known Allergies  ROS     Objective:    Physical Exam Constitutional:      General: She is not in acute distress.    Appearance: Normal appearance.  HENT:     Head: Normocephalic and atraumatic.     Right Ear: External ear normal.     Left Ear: External ear normal.  Eyes:     Extraocular Movements: Extraocular movements intact.     Pupils: Pupils are equal, round, and reactive to light.  Cardiovascular:     Rate and Rhythm: Normal rate and regular rhythm.     Heart sounds: Normal heart sounds. No murmur heard.    No gallop.  Pulmonary:     Effort: Pulmonary effort is normal. No respiratory distress.     Breath sounds: Normal breath sounds. No wheezing or rales.  Skin:    General: Skin is warm.  Neurological:     Mental Status: She is alert and oriented to person, place, and time.  Psychiatric:        Judgment: Judgment normal.     BP 117/75 (BP Location: Right Arm, Patient Position: Sitting, Cuff Size: Large)   Pulse 69   Temp 97.9 F (36.6 C) (Oral)   Resp 16   Ht '5\' 6"'$  (1.676 m)   Wt 177 lb (80.3 kg)   SpO2 99%   BMI 28.57 kg/m  Wt Readings from Last 3 Encounters:  09/08/22 177 lb (80.3 kg)  06/09/22 177 lb (80.3 kg)  05/24/22 180 lb (81.6 kg)       Assessment & Plan:  Essential hypertension Assessment & Plan: OK today with amlodipine alone.  Advised pt OK to remain off of hctz as long as bp is consistently <140/90.     Hyperglycemia Assessment & Plan: Lab Results  Component Value Date   HGBA1C 5.9 09/01/2022   She is working hard on increasing her exercise and improving her diet.    Thoracic back pain, unspecified back pain laterality, unspecified chronicity Assessment & Plan: Stable/improved.    Overweight Assessment & Plan: We discussed portion control, meal planning, continuing regular exercise. It does not appear that GLP-1's are covered by her plan.    Bronchitis Assessment & Plan: Resolved.    Mass of right  breast, unspecified quadrant Assessment & Plan: Ultrasound performed 2022 revealed benign cyst.      I, Angela Pear, NP, personally preformed the services described in this documentation.  All medical record entries made by the scribe were  at my direction and in my presence.  I have reviewed the chart and discharge instructions (if applicable) and agree that the record reflects my personal performance and is accurate and complete. 09/08/2022  Angela Pear, NP  Harvest Dark as a scribe for Angela Pear, NP.,have documented all relevant documentation on the behalf of Angela Pear, NP,as directed by  Angela Pear, NP while in the presence of Angela Pear, NP.

## 2022-09-08 NOTE — Assessment & Plan Note (Signed)
Stable/improved

## 2022-09-08 NOTE — Assessment & Plan Note (Signed)
Lab Results  Component Value Date   HGBA1C 5.9 09/01/2022   She is working hard on increasing her exercise and improving her diet.

## 2022-09-08 NOTE — Assessment & Plan Note (Signed)
Ultrasound performed 2022 revealed benign cyst.

## 2022-09-08 NOTE — Assessment & Plan Note (Signed)
We discussed portion control, meal planning, continuing regular exercise. It does not appear that GLP-1's are covered by her plan.

## 2022-09-08 NOTE — Assessment & Plan Note (Signed)
Resolved

## 2022-09-08 NOTE — Assessment & Plan Note (Signed)
OK today with amlodipine alone.  Advised pt OK to remain off of hctz as long as bp is consistently <140/90.

## 2022-09-29 ENCOUNTER — Ambulatory Visit: Payer: BC Managed Care – PPO | Admitting: Behavioral Health

## 2022-11-11 ENCOUNTER — Other Ambulatory Visit (HOSPITAL_BASED_OUTPATIENT_CLINIC_OR_DEPARTMENT_OTHER): Payer: Self-pay

## 2022-11-11 ENCOUNTER — Other Ambulatory Visit: Payer: Self-pay | Admitting: Family

## 2022-11-11 DIAGNOSIS — I10 Essential (primary) hypertension: Secondary | ICD-10-CM

## 2022-11-11 MED ORDER — AMLODIPINE BESYLATE 10 MG PO TABS
10.0000 mg | ORAL_TABLET | Freq: Every day | ORAL | 0 refills | Status: DC
  Filled 2022-11-11: qty 30, 30d supply, fill #0
  Filled 2023-03-09: qty 30, 30d supply, fill #1
  Filled 2023-04-18: qty 30, 30d supply, fill #2

## 2023-03-21 ENCOUNTER — Other Ambulatory Visit (HOSPITAL_BASED_OUTPATIENT_CLINIC_OR_DEPARTMENT_OTHER): Payer: Self-pay

## 2023-03-21 MED ORDER — INFLUENZA VIRUS VACC SPLIT PF (FLUZONE) 0.5 ML IM SUSY
0.5000 mL | PREFILLED_SYRINGE | Freq: Once | INTRAMUSCULAR | 0 refills | Status: AC
  Filled 2023-03-21: qty 0.5, 1d supply, fill #0

## 2023-04-26 ENCOUNTER — Ambulatory Visit: Payer: BC Managed Care – PPO | Admitting: Family

## 2023-04-26 VITALS — BP 128/88 | HR 69 | Resp 16 | Ht 66.0 in | Wt 171.0 lb

## 2023-04-26 DIAGNOSIS — M25561 Pain in right knee: Secondary | ICD-10-CM | POA: Insufficient documentation

## 2023-04-26 DIAGNOSIS — S86012A Strain of left Achilles tendon, initial encounter: Secondary | ICD-10-CM | POA: Insufficient documentation

## 2023-04-26 NOTE — Progress Notes (Signed)
Subjective:     Patient ID: Angela Pollard, female    DOB: Feb 11, 1986, 37 y.o.   MRN: [id number]  Chief Complaint  Patient presents with   Knee Pain    Patient complains of right knee pain   Leg Pain    Complains of pain on left lower leg    Knee Pain   Leg Pain     Discussed the use of AI scribe software for clinical note transcription with the patient, who gave verbal consent to proceed.   Patient is a 37 year old female that presents today with a new onset of right knee pain and left ankle pain. Patient states that she does play a lot of sports like pickle ball to help her stay active and to help her loose weight. She states that the knee pain and the ankle pain started about 1 weeks ago. Denies any injury that she can recall. Knee pain started over last week. She states that when she is playing sports that the ankle nor the knee hurts, she just notices the pain when she is done with her activity. She denies using heat and she stated that when she was trying to wrap the ankle made the pain worse.   Left ankle is tender to the touch. She states she is more concerned about her ankle than her knee.   She denies having any injury to the ankle. She states that elevation of the ankle is helping some with the pain.   She states that she does check her blood pressure at home and that her range is about 120-130s and mid 90s for diastolic.   No swelling noted in ankle or any discoloration. Pulses are strong and intact. Flexion and adduction is where she feels that most pain.      Health Maintenance Due  Topic Date Due   HPV VACCINES (3 - 3-dose series) 03/27/2008    Past Medical History:  Diagnosis Date   Hyperglycemia    Hypertension    x 4-5 years   Infertility, female 02/20/2020    Past Surgical History:  Procedure Laterality Date   CESAREAN SECTION  09/2020   NO PAST SURGERIES      Family History  Problem Relation Age of Onset   Brain cancer Mother 42    Cancer Mother 58       brain   Kidney failure Father    CVA Father    Hypertension Father    Diabetes Mellitus II Father    Hyperlipidemia Father    Diabetes Mellitus II Half-Sister    Heart failure Maternal Aunt    Diabetes Neg Hx     Social History   Socioeconomic History   Marital status: Married    Spouse name: Not on file   Number of children: 1   Years of education: Not on file   Highest education level: Master's degree (e.g., MA, MS, MEng, MEd, MSW, MBA)  Occupational History   Not on file  Tobacco Use   Smoking status: Former    Current packs/day: 0.30    Average packs/day: 0.3 packs/day for 8.0 years (2.4 ttl pk-yrs)    Types: Cigarettes   Smokeless tobacco: Never  Vaping Use   Vaping status: Never Used  Substance and Sexual Activity   Alcohol use: Not Currently    Comment: occasional   Drug use: Never   Sexual activity: Yes    Partners: Male    Birth control/protection: I.U.D.  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[employer], pt is a Engineer, civil (consulting)   Not married   Working on [employer] degree online   Has a son born 2008, son born 2022   Lives with significant other and son   Enjoys spending time with family, spending time with family, hiking   Social Determinants of Health   Financial Resource Strain: Low Risk  (04/25/2023)   Overall Financial Resource Strain (CARDIA)    Difficulty of Paying Living Expenses: Not very hard  Food Insecurity: No Food Insecurity (04/25/2023)   Hunger Vital Sign    Worried About Running Out of Food in the Last Year: Never true    Ran Out of Food in the Last Year: Never true  Transportation Needs: No Transportation Needs (04/25/2023)   PRAPARE - Administrator, Civil Service (Medical): No    Lack of Transportation (Non-Medical): No  Physical Activity: Sufficiently Active (04/25/2023)   Exercise Vital Sign    Days of Exercise per Week: 4 days    Minutes of Exercise per Session: 60  min  Stress: No Stress Concern Present (04/25/2023)   [employer] of Occupational Health - Occupational Stress Questionnaire    Feeling of Stress : Only a little  Social Connections: Moderately Integrated (04/25/2023)   Social Connection and Isolation Panel [NHANES]    Frequency of Communication with Friends and Family: More than three times a week    Frequency of Social Gatherings with Friends and Family: Three times a week    Attends Religious Services: 1 to 4 times per year    Active Member of Clubs or Organizations: No    Attends Engineer, structural: Not on file    Marital Status: Married  Catering manager Violence: Not on file    Outpatient Medications Prior to Visit  Medication Sig Dispense Refill   amLODipine (NORVASC) 10 MG tablet Take 1 tablet (10 mg total) by mouth daily. 90 tablet 0   levonorgestrel (LILETTA, 52 MG,) 20.1 MCG/DAY IUD 1 each by Intrauterine route once.     acetaminophen (TYLENOL) 325 MG tablet Take 650 mg by mouth every 6 (six) hours as needed.     promethazine-dextromethorphan (PROMETHAZINE-DM) 6.25-15 MG/5ML syrup Take 5 mLs by mouth 4 (four) times daily as needed for cough. 180 mL 0   No facility-administered medications prior to visit.    No Known Allergies  Review of Systems  Constitutional:  Negative for chills and diaphoresis.  HENT:  Negative for congestion and ear pain.   Eyes:  Negative for pain.  Respiratory:  Negative for cough and wheezing.   Cardiovascular:  Negative for chest pain and palpitations.  Gastrointestinal:  Negative for abdominal pain, diarrhea and vomiting.  Genitourinary:  Negative for urgency.  Musculoskeletal:  Positive for joint pain (left ankle).  Skin:  Negative for itching and rash.  Neurological:  Negative for dizziness and headaches.  Psychiatric/Behavioral:  Negative for depression.        Objective:    Physical Exam Vitals reviewed.  Constitutional:      Appearance: Normal appearance.   HENT:     Head: Normocephalic.  Cardiovascular:     Rate and Rhythm: Normal rate and regular rhythm.     Pulses: Normal pulses.     Heart sounds: Normal heart sounds.  Pulmonary:     Effort: Pulmonary effort is normal.     Breath sounds: Normal breath sounds.  Musculoskeletal:  General: Tenderness (left ankle) present.     Cervical back: Normal range of motion.  Skin:    General: Skin is warm.     Capillary Refill: Capillary refill takes less than 2 seconds.  Neurological:     General: No focal deficit present.     Mental Status: She is alert and oriented to person, place, and time. Mental status is at baseline.  Psychiatric:        Mood and Affect: Mood normal.        Behavior: Behavior normal.        Thought Content: Thought content normal.        Judgment: Judgment normal.      BP (!) 131/94 (BP Location: Right Arm, Patient Position: Sitting, Cuff Size: Normal)   Pulse 69   Resp 16   Ht 5\' 6"  (1.676 m)   Wt 171 lb (77.6 kg)   SpO2 99%   BMI 27.60 kg/m  Wt Readings from Last 3 Encounters:  04/26/23 171 lb (77.6 kg)  09/08/22 177 lb (80.3 kg)  06/09/22 177 lb (80.3 kg)       Assessment & Plan:   Problem List Items Addressed This Visit   None   I have discontinued Angela Pollard's acetaminophen and promethazine-dextromethorphan. I am also having her maintain her Liletta (52 MG) and amLODipine.  No orders of the defined types were placed in this encounter.

## 2023-04-26 NOTE — Assessment & Plan Note (Signed)
  Likely overuse from pickleball. Pain occurs during walking and house chores, not during play. -Continue to monitor.

## 2023-04-26 NOTE — Patient Instructions (Signed)
VISIT SUMMARY:  Today, we discussed your left ankle pain and right knee pain. You mentioned that the ankle pain started without any specific injury and is aggravated by flexing the ankle. The right knee pain occurs during routine activities but not during pickleball. We also reviewed your general health maintenance and weight loss efforts.  YOUR PLAN:  -LEFT ANKLE PAIN: Your pain is likely due to an Achilles strain from overuse while playing pickleball. An Achilles strain is when the tendon that connects your calf muscles to your heel becomes overstretched or torn. To help with the pain, apply Voltaren gel twice daily and rest your ankle by avoiding pickleball for one week. If the pain does not improve in a week, please message [place] via MyChart for a referral to sports medicine.  -RIGHT KNEE PAIN: The pain in your right knee is likely due to overuse, possibly from playing pickleball. Overuse injuries occur when repetitive stress is placed on a part of the body. Continue to monitor the pain and let us know if it worsens or does not improve.  -GENERAL HEALTH MAINTENANCE: Continue your weight loss efforts through physical activity as tolerated. Maintaining a healthy weight can help reduce stress on your joints and improve overall health.  INSTRUCTIONS:  Rest your left ankle and avoid playing pickleball for one week. Apply Voltaren gel to your left ankle twice daily. If your ankle pain does not improve in one week, please message [place] via MyChart for a referral to sports medicine.

## 2023-04-26 NOTE — Progress Notes (Signed)
Subjective:     Patient ID: Angela Pollard, female    DOB: 27-Jan-1986, 37 y.o.   MRN: [id number]  Chief Complaint  Patient presents with   Knee Pain    Patient complains of right knee pain   Leg Pain    Complains of pain on left lower leg    Knee Pain   Leg Pain     Discussed the use of AI scribe software for clinical note transcription with the patient, who gave verbal consent to proceed.  History of Present Illness   The patient, an active pickleball player, presents with pain adjacent to the left achilles tendon.  She denies any specific injury or event that may have caused the pain. The pain is reproduced when the ankle is flexed. There is no associated redness, swelling, or warmth. The patient has been using compression socks and attempted to wrap the ankle with an ACE bandage, but this only exacerbated the discomfort. She has refrained from taking Tylenol or Motrin due to concerns about blood pressure.  In addition to the ankle pain, the patient also reports occasional pain in the right knee. The pain does not occur during physical activity, but rather during routine activities such as walking or house chores. The patient suspects it may be due to overuse or possibly arthritis, but it is not bilateral.          Health Maintenance Due  Topic Date Due   HPV VACCINES (3 - 3-dose series) 03/27/2008    Past Medical History:  Diagnosis Date   Hyperglycemia    Hypertension    x 4-5 years   Infertility, female 02/20/2020    Past Surgical History:  Procedure Laterality Date   CESAREAN SECTION  09/2020   NO PAST SURGERIES      Family History  Problem Relation Age of Onset   Brain cancer Mother 39   Cancer Mother 59       brain   Kidney failure Father    CVA Father    Hypertension Father    Diabetes Mellitus II Father    Hyperlipidemia Father    Diabetes Mellitus II Half-Sister    Heart failure Maternal Aunt    Diabetes Neg Hx     Social History    Socioeconomic History   Marital status: Married    Spouse name: Not on file   Number of children: 1   Years of education: Not on file   Highest education level: Master's degree (e.g., MA, MS, MEng, MEd, MSW, MBA)  Occupational History   Not on file  Tobacco Use   Smoking status: Former    Current packs/day: 0.30    Average packs/day: 0.3 packs/day for 8.0 years (2.4 ttl pk-yrs)    Types: Cigarettes   Smokeless tobacco: Never  Vaping Use   Vaping status: Never Used  Substance and Sexual Activity   Alcohol use: Not Currently    Comment: occasional   Drug use: Never   Sexual activity: Yes    Partners: Male    Birth control/protection: I.U.D.  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[employer], pt is a Engineer, civil (consulting)   Not married   Working on [employer] degree online   Has a son born 2008, son born 2022   Lives with significant other and son   Enjoys spending time with family, spending time with family, hiking   Social Determinants of Health  Financial Resource Strain: Low Risk  (04/25/2023)   Overall Financial Resource Strain (CARDIA)    Difficulty of Paying Living Expenses: Not very hard  Food Insecurity: No Food Insecurity (04/25/2023)   Hunger Vital Sign    Worried About Running Out of Food in the Last Year: Never true    Ran Out of Food in the Last Year: Never true  Transportation Needs: No Transportation Needs (04/25/2023)   PRAPARE - Administrator, Civil Service (Medical): No    Lack of Transportation (Non-Medical): No  Physical Activity: Sufficiently Active (04/25/2023)   Exercise Vital Sign    Days of Exercise per Week: 4 days    Minutes of Exercise per Session: 60 min  Stress: No Stress Concern Present (04/25/2023)   [employer] of Occupational Health - Occupational Stress Questionnaire    Feeling of Stress : Only a little  Social Connections: Moderately Integrated (04/25/2023)   Social Connection and Isolation  Panel [NHANES]    Frequency of Communication with Friends and Family: More than three times a week    Frequency of Social Gatherings with Friends and Family: Three times a week    Attends Religious Services: 1 to 4 times per year    Active Member of Clubs or Organizations: No    Attends Engineer, structural: Not on file    Marital Status: Married  Catering manager Violence: Not on file    Outpatient Medications Prior to Visit  Medication Sig Dispense Refill   amLODipine (NORVASC) 10 MG tablet Take 1 tablet (10 mg total) by mouth daily. 90 tablet 0   levonorgestrel (LILETTA, 52 MG,) 20.1 MCG/DAY IUD 1 each by Intrauterine route once.     acetaminophen (TYLENOL) 325 MG tablet Take 650 mg by mouth every 6 (six) hours as needed.     promethazine-dextromethorphan (PROMETHAZINE-DM) 6.25-15 MG/5ML syrup Take 5 mLs by mouth 4 (four) times daily as needed for cough. 180 mL 0   No facility-administered medications prior to visit.    No Known Allergies  ROS    See HPI  Objective:    Physical Exam Constitutional:      Appearance: Normal appearance.  HENT:     Head: Normocephalic and atraumatic.  Eyes:     General: No scleral icterus. Cardiovascular:     Rate and Rhythm: Normal rate.  Pulmonary:     Effort: Pulmonary effort is normal.  Musculoskeletal:        General: No swelling.     Comments: + tenderness to palpation medial to left achilles tendon.    No knee swelling noted  Skin:    General: Skin is warm and dry.  Neurological:     General: No focal deficit present.     Mental Status: She is alert and oriented to person, place, and time.  Psychiatric:        Mood and Affect: Mood normal.        Behavior: Behavior normal.        Thought Content: Thought content normal.        Judgment: Judgment normal.      BP 128/88 (BP Location: Left Arm, Patient Position: Sitting, Cuff Size: Large)   Pulse 69   Resp 16   Ht 5\' 6"  (1.676 m)   Wt 171 lb (77.6 kg)   SpO2  99%   BMI 27.60 kg/m  Wt Readings from Last 3 Encounters:  04/26/23 171 lb (77.6 kg)  09/08/22 177 lb (80.3 kg)  06/09/22 177 lb (80.3 kg)       Assessment & Plan:   Problem List Items Addressed This Visit       Unprioritized   Strain of Achilles tendon, left, initial encounter - Primary     Likely Achilles strain due to overuse from pickleball. No redness, swelling, or warmth. Pain exacerbated by flexion. -Apply Voltaren gel twice daily. -Rest and avoid pickleball for one week. -If no improvement in one week, patient to message via MyChart for referral to sports medicine.      Right knee pain     Likely overuse from pickleball. Pain occurs during walking and house chores, not during play. -Continue to monitor.       I have discontinued Ninette Schwalbe's acetaminophen and promethazine-dextromethorphan. I am also having her maintain her Liletta (52 MG) and amLODipine.  No orders of the defined types were placed in this encounter.

## 2023-04-26 NOTE — Assessment & Plan Note (Signed)
  Likely Achilles strain due to overuse from pickleball. No redness, swelling, or warmth. Pain exacerbated by flexion. -Apply Voltaren gel twice daily. -Rest and avoid pickleball for one week. -If no improvement in one week, patient to message via MyChart for referral to sports medicine.

## 2023-10-01 DIAGNOSIS — R9431 Abnormal electrocardiogram [ECG] [EKG]: Secondary | ICD-10-CM | POA: Diagnosis not present

## 2023-10-01 DIAGNOSIS — J189 Pneumonia, unspecified organism: Secondary | ICD-10-CM | POA: Diagnosis not present

## 2023-10-01 DIAGNOSIS — R058 Other specified cough: Secondary | ICD-10-CM | POA: Diagnosis not present

## 2023-10-01 DIAGNOSIS — R918 Other nonspecific abnormal finding of lung field: Secondary | ICD-10-CM | POA: Diagnosis not present

## 2023-10-01 DIAGNOSIS — R0602 Shortness of breath: Secondary | ICD-10-CM | POA: Diagnosis not present

## 2023-10-01 DIAGNOSIS — R0600 Dyspnea, unspecified: Secondary | ICD-10-CM | POA: Diagnosis not present

## 2023-10-01 DIAGNOSIS — R0781 Pleurodynia: Secondary | ICD-10-CM | POA: Diagnosis not present

## 2023-10-01 DIAGNOSIS — Z975 Presence of (intrauterine) contraceptive device: Secondary | ICD-10-CM | POA: Diagnosis not present

## 2023-10-01 DIAGNOSIS — R000 Tachycardia, unspecified: Secondary | ICD-10-CM | POA: Diagnosis not present

## 2023-10-01 DIAGNOSIS — Z20822 Contact with and (suspected) exposure to covid-19: Secondary | ICD-10-CM | POA: Diagnosis not present

## 2023-10-02 ENCOUNTER — Encounter (HOSPITAL_BASED_OUTPATIENT_CLINIC_OR_DEPARTMENT_OTHER): Payer: Self-pay | Admitting: Emergency Medicine

## 2023-10-02 ENCOUNTER — Emergency Department (HOSPITAL_BASED_OUTPATIENT_CLINIC_OR_DEPARTMENT_OTHER)
Admission: EM | Admit: 2023-10-02 | Discharge: 2023-10-02 | Disposition: A | Discharge status: Home / Self Care | Attending: Emergency Medicine | Admitting: Emergency Medicine

## 2023-10-02 ENCOUNTER — Other Ambulatory Visit: Payer: Self-pay

## 2023-10-02 DIAGNOSIS — J189 Pneumonia, unspecified organism: Secondary | ICD-10-CM | POA: Diagnosis not present

## 2023-10-02 DIAGNOSIS — T7840XA Allergy, unspecified, initial encounter: Secondary | ICD-10-CM

## 2023-10-02 DIAGNOSIS — R0602 Shortness of breath: Secondary | ICD-10-CM | POA: Diagnosis not present

## 2023-10-02 DIAGNOSIS — L500 Allergic urticaria: Secondary | ICD-10-CM | POA: Diagnosis not present

## 2023-10-02 MED ORDER — AZITHROMYCIN 250 MG PO TABS
500.0000 mg | ORAL_TABLET | Freq: Once | ORAL | Status: AC
  Administered 2023-10-02: 500 mg via ORAL
  Filled 2023-10-02: qty 2

## 2023-10-02 MED ORDER — AMOXICILLIN-POT CLAVULANATE 875-125 MG PO TABS: 1.0000 | ORAL_TABLET | Freq: Two times a day (BID) | ORAL | 0 refills | Status: DC

## 2023-10-02 MED ORDER — CETIRIZINE HCL 5 MG/5ML PO SOLN
10.0000 mg | Freq: Once | ORAL | Status: AC
  Administered 2023-10-02: 10 mg via ORAL
  Filled 2023-10-02: qty 10

## 2023-10-02 MED ORDER — AZITHROMYCIN 250 MG PO TABS: 250.0000 mg | ORAL_TABLET | Freq: Every day | ORAL | 0 refills | Status: DC

## 2023-10-02 MED ORDER — AMOXICILLIN-POT CLAVULANATE 875-125 MG PO TABS
1.0000 | ORAL_TABLET | Freq: Once | ORAL | Status: AC
  Administered 2023-10-02: 1 via ORAL
  Filled 2023-10-02: qty 1

## 2023-10-02 MED ORDER — PREDNISONE 20 MG PO TABS
40.0000 mg | ORAL_TABLET | Freq: Once | ORAL | Status: AC
  Administered 2023-10-02: 40 mg via ORAL
  Filled 2023-10-02: qty 2

## 2023-10-02 MED ORDER — ALBUTEROL SULFATE HFA 108 (90 BASE) MCG/ACT IN AERS
2.0000 | INHALATION_SPRAY | RESPIRATORY_TRACT | Status: DC | PRN
  Administered 2023-10-02: 2 via RESPIRATORY_TRACT
  Filled 2023-10-02: qty 6.7

## 2023-10-02 NOTE — ED Provider Notes (Signed)
 [place] [facility] Provider Note   CSN: [id number] Arrival date & time: 10/02/23  2004     History  Chief Complaint  Patient presents with   Shortness of Breath   Allergic Reaction    Angela Pollard is a 38 y.o. female.   Shortness of Breath Allergic Reaction Patient recently treated for pneumonia.  Had been seen at [facility].  Had x-ray that showed left lower lobe pneumonia.  Started on doxycycline.  Is had a couple dose of it but then developed rash and facial swelling.  Mild shortness of breath.  Occasional cough.  Shortness of breath really not worse than it was before.  No history of allergic reactions.     Home Medications Prior to Admission medications   Medication Sig Start Date End Date Taking? Authorizing Provider  amoxicillin-clavulanate (AUGMENTIN) 875-125 MG tablet Take 1 tablet by mouth every 12 (twelve) hours. 10/02/23  Yes Benjiman Core, MD  azithromycin (ZITHROMAX) 250 MG tablet Take 1 tablet (250 mg total) by mouth daily. 10/02/23  Yes Benjiman Core, MD  amLODipine (NORVASC) 10 MG tablet Take 1 tablet (10 mg total) by mouth daily. 11/11/22   Sandford Craze, NP  levonorgestrel (LILETTA, 52 MG,) 20.1 MCG/DAY IUD 1 each by Intrauterine route once.    [provider]      Allergies    Patient has no known allergies.    Review of Systems   Review of Systems  Respiratory:  Positive for shortness of breath.     Physical Exam Updated Vital Signs BP (!) 168/127 (BP Location: Left Arm)   Pulse 89   Temp 98.6 F (37 C) (Oral)   Resp 18   Ht 5\' 6"  (1.676 m)   Wt 77.1 kg   SpO2 95%   BMI 27.44 kg/m  Physical Exam Vitals and nursing note reviewed.  HENT:     Head: Atraumatic.  Cardiovascular:     Rate and Rhythm: Regular rhythm.  Pulmonary:     Comments: Rare wheeze. Chest:     Chest wall: No tenderness.  Skin:    Capillary Refill: Capillary refill takes less than 2 seconds.     Comments: Does  have hives on face with some edema.  Also hives on extremities and trunk.  No mucous membrane involvement.  Neurological:     Mental Status: She is alert.     ED Results / Procedures / Treatments   Labs (all labs ordered are listed, but only abnormal results are displayed) Labs Reviewed - No data to display  EKG None  Radiology No results found.  Procedures Procedures    Medications Ordered in ED Medications  albuterol (VENTOLIN HFA) 108 (90 Base) MCG/ACT inhaler 2 puff (2 puffs Inhalation Given 10/02/23 2031)  azithromycin (ZITHROMAX) tablet 500 mg (500 mg Oral Given 10/02/23 2101)  amoxicillin-clavulanate (AUGMENTIN) 875-125 MG per tablet 1 tablet (1 tablet Oral Given 10/02/23 2102)  predniSONE (DELTASONE) tablet 40 mg (40 mg Oral Given 10/02/23 2102)  cetirizine HCl (Zyrtec) 5 MG/5ML solution 10 mg (10 mg Oral Given 10/02/23 2102)    ED Course/ Medical Decision Making/ A&P                                 Medical Decision Making Risk Prescription drug management.   Patient with rash after starting on antibiotics.  Does have face and trunk involvement.  No mucous membrane involvement.  Has slight wheezes but also pneumonia.  Given breathing treatment.  Doxycycline can cause reactions in the sun but had only minimal sun exposure.  Assume we will be allergy.  Will give single dose of prednisone to help counteract any allergic reaction but do not give longer course with pneumonia.  Will switch to Augmentin and azithromycin.  Appears stable for discharge home.        Final Clinical Impression(s) / ED Diagnoses Final diagnoses:  Allergic reaction, initial encounter  Community acquired pneumonia of left lower lobe of lung    Rx / DC Orders ED Discharge Orders          Ordered    amoxicillin-clavulanate (AUGMENTIN) 875-125 MG tablet  Every 12 hours        10/02/23 2114    azithromycin (ZITHROMAX) 250 MG tablet  Daily        10/02/23 2114              Benjiman Core, MD 10/02/23 2222

## 2023-10-02 NOTE — ED Triage Notes (Signed)
 Pt reports she was recently dx with PNA and started Doxy yesterday, is now having a diffuse rash, facial edema and ShoB, RT called to triage to assess, diminished with course crackles and slight wheezing

## 2023-10-24 ENCOUNTER — Other Ambulatory Visit: Payer: Self-pay | Admitting: Family

## 2023-10-24 DIAGNOSIS — I10 Essential (primary) hypertension: Secondary | ICD-10-CM

## 2023-10-25 ENCOUNTER — Other Ambulatory Visit (HOSPITAL_BASED_OUTPATIENT_CLINIC_OR_DEPARTMENT_OTHER): Payer: Self-pay

## 2023-10-25 MED ORDER — AMLODIPINE BESYLATE 10 MG PO TABS
10.0000 mg | ORAL_TABLET | Freq: Every day | ORAL | 0 refills | Status: DC
  Filled 2023-10-25: qty 30, 30d supply, fill #0

## 2023-11-15 ENCOUNTER — Ambulatory Visit (INDEPENDENT_AMBULATORY_CARE_PROVIDER_SITE_OTHER): Admitting: Family

## 2023-11-15 ENCOUNTER — Other Ambulatory Visit (HOSPITAL_COMMUNITY)
Admission: RE | Admit: 2023-11-15 | Discharge: 2023-11-15 | Disposition: A | Discharge status: Home / Self Care | Source: Ambulatory Visit | Attending: Family | Admitting: Family

## 2023-11-15 ENCOUNTER — Encounter: Payer: Self-pay | Admitting: Family

## 2023-11-15 ENCOUNTER — Ambulatory Visit: Admitting: Family

## 2023-11-15 VITALS — BP 132/86 | HR 64 | Temp 98.0°F | Resp 16 | Ht 66.0 in | Wt 166.2 lb

## 2023-11-15 DIAGNOSIS — N907 Vulvar cyst: Secondary | ICD-10-CM | POA: Diagnosis not present

## 2023-11-15 DIAGNOSIS — Z01419 Encounter for gynecological examination (general) (routine) without abnormal findings: Secondary | ICD-10-CM | POA: Diagnosis not present

## 2023-11-15 DIAGNOSIS — I10 Essential (primary) hypertension: Secondary | ICD-10-CM

## 2023-11-15 DIAGNOSIS — R739 Hyperglycemia, unspecified: Secondary | ICD-10-CM

## 2023-11-15 DIAGNOSIS — E785 Hyperlipidemia, unspecified: Secondary | ICD-10-CM | POA: Diagnosis not present

## 2023-11-15 DIAGNOSIS — Z0000 Encounter for general adult medical examination without abnormal findings: Secondary | ICD-10-CM

## 2023-11-15 LAB — COMPREHENSIVE METABOLIC PANEL WITH GFR
ALT: 11 U/L (ref 0–35)
AST: 16 U/L (ref 0–37)
Albumin: 4.2 g/dL (ref 3.5–5.2)
Alkaline Phosphatase: 84 U/L (ref 39–117)
BUN: 13 mg/dL (ref 6–23)
CO2: 28 meq/L (ref 19–32)
Calcium: 8.9 mg/dL (ref 8.4–10.5)
Chloride: 100 meq/L (ref 96–112)
Creatinine, Ser: 0.61 mg/dL (ref 0.40–1.20)
GFR: 113.34 mL/min (ref >60.00)
Glucose, Bld: 85 mg/dL (ref 70–99)
Potassium: 3.7 meq/L (ref 3.5–5.1)
Sodium: 136 meq/L (ref 135–145)
Total Bilirubin: 0.6 mg/dL (ref 0.2–1.2)
Total Protein: 7.7 g/dL (ref 6.0–8.3)

## 2023-11-15 LAB — LIPID PANEL
Cholesterol: 213 mg/dL — ABNORMAL HIGH (ref 0–200)
HDL: 49.3 mg/dL (ref >39.00)
LDL Cholesterol: 148 mg/dL — ABNORMAL HIGH (ref 0–99)
NonHDL: 163.46
Total CHOL/HDL Ratio: 4
Triglycerides: 75 mg/dL (ref 0.0–149.0)
VLDL: 15 mg/dL (ref 0.0–40.0)

## 2023-11-15 LAB — HEMOGLOBIN A1C: Hgb A1c MFr Bld: 6 % (ref 4.6–6.5)

## 2023-11-15 NOTE — Assessment & Plan Note (Addendum)
  Tetanus up to date. Flu shot and COVID booster recommended. Mammogram at 39, colonoscopy at 44. Eye and dental check-ups scheduled. - Update flu shot. - Consider COVID booster in fall. - Start mammogram screening at age 38. - Start colonoscopy screening at age 58. - Ensure eye and dental check-ups are completed. - Pap completed today

## 2023-11-15 NOTE — Assessment & Plan Note (Signed)
  Blood pressure controlled at 132/86 mmHg on amlodipine  10 mg daily. - Continue amlodipine  10 mg daily. - Recheck blood pressure in 6 months.

## 2023-11-15 NOTE — Progress Notes (Signed)
 Subjective:     Patient ID: Angela Pollard, female    DOB: 10-19-1985, 38 y.o.   MRN: [id number]  Chief Complaint  Patient presents with   Annual Exam    HPI  Discussed the use of AI scribe software for clinical note transcription with the patient, who gave verbal consent to proceed.  History of Present Illness  Angela Pollard is a 38 year old female with hypertension who presents for an annual physical exam and Pap smear. She experiences occasional tender bumps on the left labia, which sometimes rupture and resolve spontaneously. She associates these with sweating and friction from active dresses. The current bump has decreased in size. She takes 10 mg of amlodipine  daily for hypertension. She uses a Mileta IUD for birth control, placed three years ago post-childbirth. Her previous A1c was 5.9%, and her cholesterol was slightly elevated. Her weight has decreased, attributed to improved eating habits and less frequent nocturnal urination. She abstains from drugs, alcohol, tobacco, and vaping. She plays pickleball two to three times a week and describes her diet as moderately healthy. She has no current respiratory, dermatological, auditory, visual, gastrointestinal, urinary, musculoskeletal, or psychological concerns.   Immunizations: up to date Diet: working on her diet Wt Readings from Last 3 Encounters:  11/15/23 166 lb 3.2 oz (75.4 kg)  10/02/23 170 lb (77.1 kg)  04/26/23 171 lb (77.6 kg)  Exercise: walks Mammogram: will be begin at 39 Vision: scheduled Dental: scheduled     Health Maintenance Due  Topic Date Due   HPV VACCINES (3 - 3-dose series) 03/27/2008   Cervical Cancer Screening (HPV/Pap Cotest)  01/02/2024    Past Medical History:  Diagnosis Date   Hyperglycemia    Hypertension    x 4-5 years   Infertility, female 02/20/2020    Past Surgical History:  Procedure Laterality Date   CESAREAN SECTION  09/2020   NO PAST SURGERIES      Family History   Problem Relation Age of Onset   Brain cancer Mother 6   Cancer Mother 26       brain   Kidney failure Father    CVA Father    Hypertension Father    Diabetes Mellitus II Father    Hyperlipidemia Father    Diabetes Mellitus II Half-Sister    Heart failure Maternal Aunt    Diabetes Neg Hx     Social History   Socioeconomic History   Marital status: Married    Spouse name: Not on file   Number of children: 1   Years of education: Not on file   Highest education level: Master's degree (e.g., MA, MS, MEng, MEd, MSW, MBA)  Occupational History   Not on file  Tobacco Use   Smoking status: Former    Current packs/day: 0.30    Average packs/day: 0.3 packs/day for 8.0 years (2.4 ttl pk-yrs)    Types: Cigarettes   Smokeless tobacco: Never  Vaping Use   Vaping status: Never Used  Substance and Sexual Activity   Alcohol use: Not Currently    Comment: occasional   Drug use: Never   Sexual activity: Yes    Partners: Male    Birth control/protection: I.U.D.  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   Not married   Working on [employer] degree online   Has a son born 2008, son born 2022   Lives with significant other and  son   Enjoys spending time with family, spending time with family, hiking   Social Drivers of Corporate investment banker Strain: Low Risk  (11/14/2023)   Overall Financial Resource Strain (CARDIA)    Difficulty of Paying Living Expenses: Not very hard  Food Insecurity: No Food Insecurity (11/14/2023)   Hunger Vital Sign    Worried About Running Out of Food in the Last Year: Never true    Ran Out of Food in the Last Year: Never true  Transportation Needs: No Transportation Needs (11/14/2023)   PRAPARE - Administrator, Civil Service (Medical): No    Lack of Transportation (Non-Medical): No  Physical Activity: Sufficiently Active (11/14/2023)   Exercise Vital Sign    Days of Exercise per  Week: 3 days    Minutes of Exercise per Session: 60 min  Stress: No Stress Concern Present (11/14/2023)   [employer] of Occupational Health - Occupational Stress Questionnaire    Feeling of Stress : Only a little  Social Connections: Moderately Integrated (11/14/2023)   Social Connection and Isolation Panel [NHANES]    Frequency of Communication with Friends and Family: More than three times a week    Frequency of Social Gatherings with Friends and Family: More than three times a week    Attends Religious Services: 1 to 4 times per year    Active Member of [employer] or Organizations: No    Attends Engineer, structural: Not on file    Marital Status: Married  Catering manager Violence: Not on file    Outpatient Medications Prior to Visit  Medication Sig Dispense Refill   amLODipine  (NORVASC ) 10 MG tablet Take 1 tablet (10 mg total) by mouth daily. 30 tablet 0   levonorgestrel  (LILETTA , 52 MG,) 20.1 MCG/DAY IUD 1 each by Intrauterine route once.     amoxicillin -clavulanate (AUGMENTIN ) 875-125 MG tablet Take 1 tablet by mouth every 12 (twelve) hours. 10 tablet 0   azithromycin  (ZITHROMAX ) 250 MG tablet Take 1 tablet (250 mg total) by mouth daily. 4 tablet 0   No facility-administered medications prior to visit.    Allergies  Allergen Reactions   Doxycycline Rash and Swelling    Review of Systems  HENT:  Negative for congestion and hearing loss.   Eyes:  Negative for blurred vision.  Respiratory:  Negative for cough.   Cardiovascular:  Negative for leg swelling.  Gastrointestinal:  Negative for constipation and diarrhea.  Genitourinary:  Negative for dysuria and frequency.  Musculoskeletal:  Negative for joint pain and myalgias.  Skin:  Negative for rash.  Neurological:  Negative for headaches.  Psychiatric/Behavioral:         Denies depression/anxiety       Objective:     Physical Exam   BP 132/86 (BP Location: Right Arm, Patient Position: Sitting, Cuff  Size: Normal)   Pulse 64   Temp 98 F (36.7 C) (Oral)   Resp 16   Ht 5\' 6"  (1.676 m)   Wt 166 lb 3.2 oz (75.4 kg)   SpO2 99%   BMI 26.83 kg/m  Wt Readings from Last 3 Encounters:  11/15/23 166 lb 3.2 oz (75.4 kg)  10/02/23 170 lb (77.1 kg)  04/26/23 171 lb (77.6 kg)   Physical Exam  Constitutional: She is oriented to person, place, and time. She appears well-developed and well-nourished. No distress.  HENT:  Head: Normocephalic and atraumatic.  Right Ear: Tympanic membrane and ear canal normal.  Left Ear: Tympanic membrane and  ear canal normal.  Mouth/Throat: Oropharynx is clear and moist.  Eyes: Pupils are equal, round, and reactive to light. No scleral icterus.  Neck: Normal range of motion. No thyromegaly present.  Cardiovascular: Normal rate and regular rhythm.   No murmur heard. Pulmonary/Chest: Effort normal and breath sounds normal. No respiratory distress. He has no wheezes. She has no rales. She exhibits no tenderness.  Abdominal: Soft. Bowel sounds are normal. She exhibits no distension and no mass. There is no tenderness. There is no rebound and no guarding.  Musculoskeletal: She exhibits no edema.  Lymphadenopathy:    She has no cervical adenopathy.  Neurological: She is alert and oriented to person, place, and time. She has normal patellar reflexes. She exhibits normal muscle tone. Coordination normal.  Skin: Skin is warm and dry.  Psychiatric: She has a normal mood and affect. Her behavior is normal. Judgment and thought content normal.  Breasts: Examined lying Right: Without masses, retractions, discharge or axillary adenopathy.  Left: Without masses, retractions, discharge or axillary adenopathy.  Inguinal/mons: Normal without inguinal adenopathy  External genitalia: Normal (small nodular cyst noted left vulva) no drainage BUS/Urethra/Skene's glands: Normal  Bladder: Normal  Vagina: Normal  Cervix: Normal - iud strings noted in OS Uterus: normal in size,  shape and contour. Midline and mobile  Adnexa/parametria:  Rt: Without masses or tenderness.  Lt: Without masses or tenderness.  Anus and perineum: Normal            Assessment & Plan:         Assessment & Plan:   Problem List Items Addressed This Visit       Unprioritized   Vulvar cyst    Intermittent tender bumps on left labia, likely folliculitis or cyst. No infection signs. - Apply warm compresses or take sitz baths if sore. - Contact clinic if area becomes red, hot, or significantly painful.      Preventative health care - Primary    Tetanus up to date. Flu shot and COVID booster recommended. Mammogram at 39, colonoscopy at 44. Eye and dental check-ups scheduled. - Update flu shot. - Consider COVID booster in fall. - Start mammogram screening at age 19. - Start colonoscopy screening at age 60. - Ensure eye and dental check-ups are completed.      Hyperglycemia   Relevant Orders   HgB A1c   Essential hypertension    Blood pressure controlled at 132/86 mmHg on amlodipine  10 mg daily. - Continue amlodipine  10 mg daily. - Recheck blood pressure in 6 months.      Other Visit Diagnoses       Hyperlipidemia, unspecified hyperlipidemia type       Relevant Orders   Comp Met (CMET)   Lipid panel     Encounter for routine gynecological examination with Papanicolaou smear of cervix       Relevant Orders   Cytology - PAP( [place])       I have discontinued Daven Mccain's amoxicillin -clavulanate and azithromycin . I am also having her maintain her Liletta  (52 MG) and amLODipine .  No orders of the defined types were placed in this encounter.

## 2023-11-15 NOTE — Patient Instructions (Signed)
 VISIT SUMMARY:  You came in for your annual physical exam and Pap smear. We discussed your occasional tender bumps, your hypertension, and general health maintenance.  YOUR PLAN:  FOLLICULITIS OR CYST: You have occasional tender bumps on your left labia, likely due to folliculitis or a cyst. There are no signs of infection. -Apply warm compresses or take sitz baths if the area is sore. -Contact the clinic if the area becomes red, hot, or significantly painful.  HYPERTENSION: Your blood pressure is well controlled at 132/86 mmHg with your current medication. -Continue taking amlodipine  10 mg daily. -Recheck your blood pressure in 6 months.  GENERAL HEALTH MAINTENANCE: We reviewed your general health maintenance needs. -Your tetanus shot is up to date. -Get your flu shot updated. -Consider getting a COVID booster in the fall. -Start mammogram screening at age 39. -Start colonoscopy screening at age 38. -Ensure you complete your eye and dental check-ups.

## 2023-11-15 NOTE — Assessment & Plan Note (Signed)
  Intermittent tender bumps on left labia, likely folliculitis or cyst. No infection signs. - Apply warm compresses or take sitz baths if sore. - Contact clinic if area becomes red, hot, or significantly painful.

## 2023-11-16 ENCOUNTER — Ambulatory Visit: Payer: Self-pay | Admitting: Family

## 2023-11-24 LAB — CYTOLOGY - PAP
Comment: NEGATIVE
Diagnosis: NEGATIVE
Diagnosis: REACTIVE
High risk HPV: NEGATIVE

## 2023-12-07 ENCOUNTER — Encounter: Admitting: Family

## 2023-12-18 ENCOUNTER — Ambulatory Visit
Admission: EM | Admit: 2023-12-18 | Discharge: 2023-12-18 | Disposition: A | Discharge status: Home / Self Care | Attending: Internal Medicine | Admitting: Internal Medicine

## 2023-12-18 DIAGNOSIS — L578 Other skin changes due to chronic exposure to nonionizing radiation: Secondary | ICD-10-CM

## 2023-12-18 DIAGNOSIS — R21 Rash and other nonspecific skin eruption: Secondary | ICD-10-CM | POA: Diagnosis not present

## 2023-12-18 MED ORDER — VALACYCLOVIR HCL 1 G PO TABS: 1000.0000 mg | ORAL_TABLET | Freq: Three times a day (TID) | ORAL | 0 refills | Status: AC

## 2023-12-18 NOTE — Discharge Instructions (Addendum)
 The area on the left posterior ear is firm nodules that do have small pits in the center.  They are more solid than would be expected with shingles.  They are painful but still think these are low risk to be shingles.  This may be a molluscum  contagiosum.  This is a self-limiting condition.  If it is persistent can treat with topical medication.  Due to the areas being painful we can cover for shingles to be safe although believe this is a very low likelihood.  We will treat with the following: Valacyclovir 1000 mg 3 times daily for 7 days.  Monitor area and return if worsening For the area on the top of the ear, recommend avoiding sun exposure on that area for 2 weeks and make sure to always apply sunscreen to the ears when outside. Return to urgent care or PCP if symptoms worsen or fail to resolve.

## 2023-12-18 NOTE — ED Triage Notes (Signed)
 Pt c/o rash to left ear started 6/18-pain to area x 2 days with left neck lymph node swelling-NAD-steady gait

## 2023-12-18 NOTE — ED Provider Notes (Signed)
 [facility]    CSN: [id number] Arrival date & time: 12/18/23  1051      History   Chief Complaint Chief Complaint  Patient presents with   Rash    HPI Angela Pollard is a 38 y.o. female.   38 year old female who presents urgent care with complaints of a painful area behind her left ear.  She reports that she noticed this about 2 days ago.  She does report that the day prior she was outside in the sun for a long period of time and was concerned she may have gotten sunburned but she was also concerned because the area was very painful and felt like raised bumps.  She was concerned she might have shingles.  She has not had any exposures that she knows of.  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  The area on the back of the ear has not been draining and does not seem to be fluid-filled.  She also has a bluish discoloration on the very top of her ear that is mildly tender as well.  She denies any fevers, chills, shortness of breath or difficulty breathing.  She has noticed a little bit of swelling behind her ear close to her lymph nodes.   Rash Associated symptoms: no abdominal pain, no fever, no joint pain, no shortness of breath, no sore throat and not vomiting     Past Medical History:  Diagnosis Date   Hyperglycemia    Hypertension    x 4-5 years   Infertility, female 02/20/2020    Patient Active Problem List   Diagnosis Date Noted   Vulvar cyst 11/15/2023   Strain of Achilles tendon, left, initial encounter 04/26/2023   Right knee pain 04/26/2023   Overweight 09/08/2022   Anxiety 06/09/2022   Hyperglycemia 06/09/2022   Thoracic back pain 05/25/2022   Bronchitis 01/11/2022   Preventative health care 05/15/2021   Essential hypertension 01/02/2019    Past Surgical History:  Procedure Laterality Date   CESAREAN SECTION  09/2020   NO PAST SURGERIES      OB History     Gravida  2   Para  2   Term  1    Preterm  1   AB      Living  2      SAB      IAB      Ectopic      Multiple      Live Births  2            Home Medications    Prior to Admission medications   Medication Sig Start Date End Date Taking? Authorizing Provider  valACYclovir (VALTREX) 1000 MG tablet Take 1 tablet (1,000 mg total) by mouth 3 (three) times daily for 7 days. 12/18/23 12/25/23 Yes Sulamita Lafountain A, PA-C  amLODipine  (NORVASC ) 10 MG tablet Take 1 tablet (10 mg total) by mouth daily. 10/25/23   O'Sullivan, Melissa, NP  levonorgestrel  (LILETTA , 52 MG,) 20.1 MCG/DAY IUD 1 each by Intrauterine route once.    [provider]    Family History Family History  Problem Relation Age of Onset   Brain cancer Mother 4   Cancer Mother 69       brain   Kidney failure Father    CVA Father    Hypertension Father    Diabetes Mellitus II Father    Hyperlipidemia Father    Diabetes Mellitus  II Half-Sister    Heart failure Maternal Aunt    Diabetes Neg Hx     Social History Social History   Tobacco Use   Smoking status: Former    Current packs/day: 0.30    Average packs/day: 0.3 packs/day for 8.0 years (2.4 ttl pk-yrs)    Types: Cigarettes   Smokeless tobacco: Never  Vaping Use   Vaping status: Never Used  Substance Use Topics   Alcohol use: Yes    Comment: occasional   Drug use: Never     Allergies   Doxycycline   Review of Systems Review of Systems  Constitutional:  Negative for chills and fever.  HENT:  Negative for ear pain and sore throat.   Eyes:  Negative for pain and visual disturbance.  Respiratory:  Negative for cough and shortness of breath.   Cardiovascular:  Negative for chest pain and palpitations.  Gastrointestinal:  Negative for abdominal pain and vomiting.  Genitourinary:  Negative for dysuria and hematuria.  Musculoskeletal:  Negative for arthralgias and back pain.  Skin:  Positive for color change and rash.  Neurological:  Negative for seizures and  syncope.  All other systems reviewed and are negative.    Physical Exam Triage Vital Signs ED Triage Vitals  Encounter Vitals Group     BP 12/18/23 1138 (!) 148/107     Girls Systolic BP Percentile --      Girls Diastolic BP Percentile --      Boys Systolic BP Percentile --      Boys Diastolic BP Percentile --      Pulse Rate 12/18/23 1138 60     Resp 12/18/23 1138 20     Temp 12/18/23 1138 98.7 F (37.1 C)     Temp Source 12/18/23 1138 Oral     SpO2 12/18/23 1138 98 %     Weight --      Height --      Head Circumference --      Peak Flow --      Pain Score 12/18/23 1136 5     Pain Loc --      Pain Education --      Exclude from Growth Chart --    No data found.  Updated Vital Signs BP (!) 148/107 (BP Location: Left Arm)   Pulse 60   Temp 98.7 F (37.1 C) (Oral)   Resp 20   SpO2 98%   Visual Acuity Right Eye Distance:   Left Eye Distance:   Bilateral Distance:    Right Eye Near:   Left Eye Near:    Bilateral Near:     Physical Exam Vitals and nursing note reviewed.  Constitutional:      General: She is not in acute distress.    Appearance: She is well-developed.  HENT:     Head: Normocephalic and atraumatic.     Ears:    Eyes:     Conjunctiva/sclera: Conjunctivae normal.    Cardiovascular:     Rate and Rhythm: Normal rate.     Heart sounds: No murmur heard. Pulmonary:     Effort: Pulmonary effort is normal. No respiratory distress.   Musculoskeletal:        General: No swelling.     Cervical back: Neck supple.   Skin:    General: Skin is warm and dry.     Capillary Refill: Capillary refill takes less than 2 seconds.   Neurological:     Mental Status: She is alert.  Psychiatric:        Mood and Affect: Mood normal.      UC Treatments / Results  Labs (all labs ordered are listed, but only abnormal results are displayed) Labs Reviewed - No data to display  EKG   Radiology No results found.  Procedures Procedures (including  critical care time)  Medications Ordered in UC Medications - No data to display  Initial Impression / Assessment and Plan / UC Course  I have reviewed the triage vital signs and the nursing notes.  Pertinent labs & imaging results that were available during my care of the patient were reviewed by me and considered in my medical decision making (see chart for details).     Rash and nonspecific skin eruption  Sun-damaged skin   The area on the left posterior ear is firm nodules that do have small pits in the center.  They are more solid than would be expected with shingles.  They are painful but still think these are low risk to be shingles.  This may be a molluscum  contagiosum.  This is a self-limiting condition.  If it is persistent can treat with topical medication.  Due to the areas being painful we can cover for shingles to be safe although believe this is a very low likelihood.  We will treat with the following: Valacyclovir 1000 mg 3 times daily for 7 days.  Monitor area and return if worsening For the area on the top of the ear, recommend avoiding sun exposure on that area for 2 weeks and make sure to always apply sunscreen to the ears when outside. Return to urgent care or PCP if symptoms worsen or fail to resolve.    Final Clinical Impressions(s) / UC Diagnoses   Final diagnoses:  Rash and nonspecific skin eruption  Sun-damaged skin     Discharge Instructions      The area on the left posterior ear is firm nodules that do have small pits in the center.  They are more solid than would be expected with shingles.  They are painful but still think these are low risk to be shingles.  This may be a molluscum  contagiosum.  This is a self-limiting condition.  If it is persistent can treat with topical medication.  Due to the areas being painful we can cover for shingles to be safe although believe this is a very low likelihood.  We will treat with the following: Valacyclovir 1000  mg 3 times daily for 7 days.  Monitor area and return if worsening For the area on the top of the ear, recommend avoiding sun exposure on that area for 2 weeks and make sure to always apply sunscreen to the ears when outside. Return to urgent care or PCP if symptoms worsen or fail to resolve.      ED Prescriptions     Medication Sig Dispense Auth. Provider   valACYclovir (VALTREX) 1000 MG tablet Take 1 tablet (1,000 mg total) by mouth 3 (three) times daily for 7 days. 21 tablet Teresa Almarie LABOR, PA-C      PDMP not reviewed this encounter.   Teresa Almarie LABOR, PA-C 12/18/23 1236

## 2024-02-22 ENCOUNTER — Other Ambulatory Visit (HOSPITAL_BASED_OUTPATIENT_CLINIC_OR_DEPARTMENT_OTHER): Payer: Self-pay

## 2024-02-22 ENCOUNTER — Ambulatory Visit: Admitting: Family

## 2024-02-22 ENCOUNTER — Other Ambulatory Visit: Payer: Self-pay | Admitting: Family

## 2024-02-22 VITALS — BP 140/100 | HR 70 | Temp 98.2°F | Resp 16 | Ht 66.0 in | Wt 167.0 lb

## 2024-02-22 DIAGNOSIS — I10 Essential (primary) hypertension: Secondary | ICD-10-CM

## 2024-02-22 DIAGNOSIS — M5442 Lumbago with sciatica, left side: Secondary | ICD-10-CM | POA: Diagnosis not present

## 2024-02-22 DIAGNOSIS — R739 Hyperglycemia, unspecified: Secondary | ICD-10-CM

## 2024-02-22 MED ORDER — METHOCARBAMOL 500 MG PO TABS: 500.0000 mg | ORAL_TABLET | Freq: Three times a day (TID) | ORAL | 0 refills | Status: AC | PRN

## 2024-02-22 MED ORDER — METHYLPREDNISOLONE 4 MG PO TBPK: ORAL_TABLET | ORAL | 0 refills | Status: AC

## 2024-02-22 MED ORDER — AMLODIPINE BESYLATE 10 MG PO TABS
10.0000 mg | ORAL_TABLET | Freq: Every day | ORAL | 0 refills | Status: AC
  Filled 2024-02-22 – 2024-03-08 (×2): qty 30, 30d supply, fill #0
  Filled 2024-07-24: qty 30, 30d supply, fill #1

## 2024-02-22 NOTE — Progress Notes (Signed)
 Subjective:     Patient ID: Angela Pollard, female    DOB: September 24, 1985, 38 y.o.   MRN: [id number]  Chief Complaint  Patient presents with   Back Pain    Patient complains of low back pain for about 10 days     Back Pain    Discussed the use of AI scribe software for clinical note transcription with the patient, who gave verbal consent to proceed.  History of Present Illness  Angela Pollard is a 38 year old female who presents with lower back pain exacerbated by physical activity.  She has experienced lower back pain for 12 days, beginning after intense physical activity, including pickleball. The pain is characterized by stiffness, worsens with rest, and improves with movement. It radiates to the outside of her left leg as a pulling sensation. Stretching and warming up before activity provide relief.  She participated in a pickleball tournament and used Motrin for pain management, taking 800 mg in the morning and 600 mg at night. She noticed dark stools after taking Motrin and has discontinued its use due to concerns about side effects.  A recent fall, involving a twisted ankle and a fall on the opposite side, exacerbated her back pain. She experiences discomfort in her core when rising but denies leg weakness.  She is concerned about elevated blood pressure, which she attributes to pain and frustration, and is managing it through lifestyle changes, including meditation.       Health Maintenance Due  Topic Date Due   Hepatitis B Vaccines 19-59 Average Risk (3 of 3 - 19+ 3-dose series) 11/21/2007   HPV VACCINES (3 - 3-dose series) 03/27/2008   INFLUENZA VACCINE  01/27/2024    Past Medical History:  Diagnosis Date   Hyperglycemia    Hypertension    x 4-5 years   Infertility, female 02/20/2020    Past Surgical History:  Procedure Laterality Date   CESAREAN SECTION  09/2020   NO PAST SURGERIES      Family History  Problem Relation Age of Onset   Brain  cancer Mother 49   Cancer Mother 68       brain   Kidney failure Father    CVA Father    Hypertension Father    Diabetes Mellitus II Father    Hyperlipidemia Father    Diabetes Mellitus II Half-Sister    Heart failure Maternal Aunt    Diabetes Neg Hx     Social History   Socioeconomic History   Marital status: Married    Spouse name: Not on file   Number of children: 1   Years of education: Not on file   Highest education level: Master's degree (e.g., MA, MS, MEng, MEd, MSW, MBA)  Occupational History   Not on file  Tobacco Use   Smoking status: Former    Current packs/day: 0.30    Average packs/day: 0.3 packs/day for 8.0 years (2.4 ttl pk-yrs)    Types: Cigarettes   Smokeless tobacco: Never  Vaping Use   Vaping status: Never Used  Substance and Sexual Activity   Alcohol use: Yes    Comment: occasional   Drug use: Never   Sexual activity: Yes    Partners: Male    Birth control/protection: I.U.D.  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   Not married   Working on [employer] degree online   Has a son born 2008,  son born 2022   Lives with significant other and son   Enjoys spending time with family, spending time with family, hiking   Social Drivers of Corporate investment banker Strain: Low Risk  (02/22/2024)   Overall Financial Resource Strain (CARDIA)    Difficulty of Paying Living Expenses: Not very hard  Food Insecurity: No Food Insecurity (02/22/2024)   Hunger Vital Sign    Worried About Running Out of Food in the Last Year: Never true    Ran Out of Food in the Last Year: Never true  Transportation Needs: No Transportation Needs (02/22/2024)   PRAPARE - Administrator, Civil Service (Medical): No    Lack of Transportation (Non-Medical): No  Physical Activity: Sufficiently Active (02/22/2024)   Exercise Vital Sign    Days of Exercise per Week: 3 days    Minutes of Exercise per Session: 60  min  Stress: No Stress Concern Present (02/22/2024)   [employer] of Occupational Health - Occupational Stress Questionnaire    Feeling of Stress: Only a little  Social Connections: Moderately Integrated (02/22/2024)   Social Connection and Isolation Panel    Frequency of Communication with Friends and Family: More than three times a week    Frequency of Social Gatherings with Friends and Family: More than three times a week    Attends Religious Services: 1 to 4 times per year    Active Member of [employer] or Organizations: No    Attends Engineer, structural: Not on file    Marital Status: Married  Catering manager Violence: Not on file    Outpatient Medications Prior to Visit  Medication Sig Dispense Refill   amLODipine  (NORVASC ) 10 MG tablet Take 1 tablet (10 mg total) by mouth daily. 30 tablet 0   levonorgestrel  (LILETTA , 52 MG,) 20.1 MCG/DAY IUD 1 each by Intrauterine route once.     No facility-administered medications prior to visit.    Allergies  Allergen Reactions   Doxycycline Rash and Swelling    Review of Systems  Musculoskeletal:  Positive for back pain.       Objective:    Physical Exam Constitutional:      General: She is not in acute distress.    Appearance: Normal appearance. She is well-developed.  HENT:     Head: Normocephalic and atraumatic.     Right Ear: External ear normal.     Left Ear: External ear normal.  Eyes:     General: No scleral icterus. Neck:     Thyroid : No thyromegaly.  Cardiovascular:     Rate and Rhythm: Normal rate and regular rhythm.     Heart sounds: Normal heart sounds. No murmur heard. Pulmonary:     Effort: Pulmonary effort is normal. No respiratory distress.     Breath sounds: Normal breath sounds. No wheezing.  Musculoskeletal:     Cervical back: Normal and neck supple. No swelling or tenderness.     Thoracic back: Normal. No swelling or spasms.     Lumbar back: Normal.     Comments: Mild tenderness low  lumbar spine  Skin:    General: Skin is warm and dry.  Neurological:     Mental Status: She is alert and oriented to person, place, and time.     Deep Tendon Reflexes:     Reflex Scores:      Patellar reflexes are 2+ on the right side and 2+ on the left side.    Comments: Bilateral LE  strength is 5/5  Psychiatric:        Mood and Affect: Mood normal.        Behavior: Behavior normal.        Thought Content: Thought content normal.        Judgment: Judgment normal.      BP (!) 140/100   Pulse 70   Temp 98.2 F (36.8 C) (Oral)   Resp 16   Ht 5' 6 (1.676 m)   Wt 167 lb (75.8 kg)   SpO2 99%   Breastfeeding No   BMI 26.95 kg/m  Wt Readings from Last 3 Encounters:  02/22/24 167 lb (75.8 kg)  11/15/23 166 lb 3.2 oz (75.4 kg)  10/02/23 170 lb (77.1 kg)       Assessment & Plan:   Problem List Items Addressed This Visit       Unprioritized   Midline low back pain with left-sided sciatica - Primary    Acute low back pain with left leg radiculopathy, likely due to recent physical activity and minor injury. Possible L5-S1 involvement. Recent fall may have worsened symptoms. - Prescribed Robaxin  for muscle relaxation. - Prescribed Medrol  Dosepak for inflammation control, discussed potential temporary increase in blood sugar by about 50 points. - Referred to physical therapy. - Advised to avoid strenuous activities, encouraged gentle movement and stretching. -She will let me know if she sees any further dark stools.      Relevant Medications   methylPREDNISolone  (MEDROL  DOSEPAK) 4 MG TBPK tablet   methocarbamol  (ROBAXIN ) 500 MG tablet   Other Relevant Orders   Ambulatory referral to Physical Therapy   Hyperglycemia   Update A1C.       Relevant Orders   HgB A1c   Essential hypertension   BP elevated today. Likely related to pain. Advised her to monitor BP at home and let me know if she continues to see high readings at home. Continue amlodipine  10 mg.        I am  having Jenean Dykstra start on methylPREDNISolone  and methocarbamol . I am also having her maintain her Liletta  (52 MG) and amLODipine .  Meds ordered this encounter  Medications   methylPREDNISolone  (MEDROL  DOSEPAK) 4 MG TBPK tablet    Sig: Take per package instructions    Dispense:  21 tablet    Refill:  0    Supervising Provider:   DOMENICA BLACKBIRD A [[id number]]   methocarbamol  (ROBAXIN ) 500 MG tablet    Sig: Take 1 tablet (500 mg total) by mouth every 8 (eight) hours as needed.    Dispense:  20 tablet    Refill:  0    Supervising Provider:   DOMENICA BLACKBIRD A [[id number]]

## 2024-02-22 NOTE — Assessment & Plan Note (Signed)
 Update A1C

## 2024-02-22 NOTE — Assessment & Plan Note (Signed)
 BP elevated today. Likely related to pain. Advised her to monitor BP at home and let me know if she continues to see high readings at home. Continue amlodipine  10 mg.

## 2024-02-22 NOTE — Assessment & Plan Note (Addendum)
  Acute low back pain with left leg radiculopathy, likely due to recent physical activity and minor injury. Possible L5-S1 involvement. Recent fall may have worsened symptoms. - Prescribed Robaxin  for muscle relaxation. - Prescribed Medrol  Dosepak for inflammation control, discussed potential temporary increase in blood sugar by about 50 points. - Referred to physical therapy. - Advised to avoid strenuous activities, encouraged gentle movement and stretching. -She will let me know if she sees any further dark stools.

## 2024-02-23 ENCOUNTER — Ambulatory Visit: Payer: Self-pay | Admitting: Family

## 2024-02-23 LAB — HEMOGLOBIN A1C: Hgb A1c MFr Bld: 6.2 % (ref 4.6–6.5)

## 2024-02-29 ENCOUNTER — Telehealth: Admitting: Family Medicine

## 2024-02-29 DIAGNOSIS — S01331A Puncture wound without foreign body of right ear, initial encounter: Secondary | ICD-10-CM | POA: Diagnosis not present

## 2024-02-29 MED ORDER — CEPHALEXIN 500 MG PO CAPS: 500.0000 mg | ORAL_CAPSULE | Freq: Three times a day (TID) | ORAL | 0 refills | Status: AC

## 2024-02-29 NOTE — Patient Instructions (Signed)

## 2024-02-29 NOTE — Progress Notes (Signed)
 Virtual Visit Consent   Angela Pollard, you are scheduled for a virtual visit with a  provider today. Just as with appointments in the office, your consent must be obtained to participate. Your consent will be active for this visit and any virtual visit you may have with one of our providers in the next 365 days. If you have a MyChart account, a copy of this consent can be sent to you electronically.  As this is a virtual visit, video technology does not allow for your provider to perform a traditional examination. This may limit your provider's ability to fully assess your condition. If your provider identifies any concerns that need to be evaluated in person or the need to arrange testing (such as labs, EKG, etc.), we will make arrangements to do so. Although advances in technology are sophisticated, we cannot ensure that it will always work on either your end or our end. If the connection with a video visit is poor, the visit may have to be switched to a telephone visit. With either a video or telephone visit, we are not always able to ensure that we have a secure connection.  By engaging in this virtual visit, you consent to the provision of healthcare and authorize for your insurance to be billed (if applicable) for the services provided during this visit. Depending on your insurance coverage, you may receive a charge related to this service.  I need to obtain your verbal consent now. Are you willing to proceed with your visit today? Angela Pollard has provided verbal consent on 02/29/2024 for a virtual visit (video or telephone). Angela Lamp, FNP  Date: 02/29/2024 6:25 PM   Virtual Visit via Video Note   I, Angela Pollard, connected with  Angela Pollard  ([id number], [id number]) on 02/29/24 at  6:15 PM EDT by a video-enabled telemedicine application and verified that I am speaking with the correct person using two identifiers.  Location: Patient: Virtual Visit Location Patient:  Home Provider: Virtual Visit Location Provider: Home Office   I discussed the limitations of evaluation and management by telemedicine and the availability of in person appointments. The patient expressed understanding and agreed to proceed.    History of Present Illness: Angela Pollard is a 38 y.o. who identifies as a female who was assigned female at birth, and is being seen today for infected piercing of rt ear tragus with redness and purulent drainage. Mild. SABRA  HPI: HPI  Problems:  Patient Active Problem List   Diagnosis Date Noted   Midline low back pain with left-sided sciatica 02/22/2024   Vulvar cyst 11/15/2023   Strain of Achilles tendon, left, initial encounter 04/26/2023   Right knee pain 04/26/2023   Overweight 09/08/2022   Anxiety 06/09/2022   Hyperglycemia 06/09/2022   Thoracic back pain 05/25/2022   Preventative health care 05/15/2021   Essential hypertension 01/02/2019    Allergies:  Allergies  Allergen Reactions   Doxycycline Rash and Swelling   Medications:  Current Outpatient Medications:    amLODipine  (NORVASC ) 10 MG tablet, Take 1 tablet (10 mg total) by mouth daily., Disp: 90 tablet, Rfl: 0   levonorgestrel  (LILETTA , 52 MG,) 20.1 MCG/DAY IUD, 1 each by Intrauterine route once., Disp: , Rfl:    methocarbamol  (ROBAXIN ) 500 MG tablet, Take 1 tablet (500 mg total) by mouth every 8 (eight) hours as needed., Disp: 20 tablet, Rfl: 0   methylPREDNISolone  (MEDROL  DOSEPAK) 4 MG TBPK tablet, Take per package instructions, Disp: 21 tablet, Rfl: 0  Observations/Objective:  Patient is well-developed, well-nourished in no acute distress.  Resting comfortably  at home.  Head is normocephalic, atraumatic.  No labored breathing.  Speech is clear and coherent with logical content.  Patient is alert and oriented at baseline.   Assessment and Plan: 1. Complication of right ear piercing, initial encounter (Primary)  Clean BID with alcohol, remove and allow to heal if  sx persist or worsen, UC if sx persist.   Follow Up Instructions: I discussed the assessment and treatment plan with the patient. The patient was provided an opportunity to ask questions and all were answered. The patient agreed with the plan and demonstrated an understanding of the instructions.  A copy of instructions were sent to the patient via MyChart unless otherwise noted below.     The patient was advised to call back or seek an in-person evaluation if the symptoms worsen or if the condition fails to improve as anticipated.    Jakiah Goree, FNP

## 2024-03-06 ENCOUNTER — Other Ambulatory Visit (HOSPITAL_BASED_OUTPATIENT_CLINIC_OR_DEPARTMENT_OTHER): Payer: Self-pay

## 2024-03-08 ENCOUNTER — Ambulatory Visit: Attending: Family | Admitting: Rehabilitation

## 2024-03-08 ENCOUNTER — Other Ambulatory Visit (HOSPITAL_BASED_OUTPATIENT_CLINIC_OR_DEPARTMENT_OTHER): Payer: Self-pay

## 2024-03-08 DIAGNOSIS — M5442 Lumbago with sciatica, left side: Secondary | ICD-10-CM | POA: Diagnosis not present

## 2024-03-08 DIAGNOSIS — M5459 Other low back pain: Secondary | ICD-10-CM | POA: Insufficient documentation

## 2024-03-08 DIAGNOSIS — M6281 Muscle weakness (generalized): Secondary | ICD-10-CM | POA: Insufficient documentation

## 2024-03-08 DIAGNOSIS — R293 Abnormal posture: Secondary | ICD-10-CM | POA: Diagnosis not present

## 2024-03-08 NOTE — Therapy (Signed)
 OUTPATIENT PHYSICAL THERAPY THORACOLUMBAR EVALUATION   Patient Name: Angela Pollard MRN: [id number] DOB:Jun 28, 1986, 38 y.o., female Today's Date: 03/08/2024  END OF SESSION:   Past Medical History:  Diagnosis Date   Hyperglycemia    Hypertension    x 4-5 years   Infertility, female 02/20/2020   Past Surgical History:  Procedure Laterality Date   CESAREAN SECTION  09/2020   NO PAST SURGERIES     Patient Active Problem List   Diagnosis Date Noted   Midline low back pain with left-sided sciatica 02/22/2024   Vulvar cyst 11/15/2023   Strain of Achilles tendon, left, initial encounter 04/26/2023   Right knee pain 04/26/2023   Overweight 09/08/2022   Anxiety 06/09/2022   Hyperglycemia 06/09/2022   Thoracic back pain 05/25/2022   Preventative health care 05/15/2021   Essential hypertension 01/02/2019    PCP: Daryl Setter, NP   REFERRING PROVIDER: Daryl Setter, NP   REFERRING DIAG: [phone] (ICD-10-CM) - Midline low back pain with left-sided sciatica, unspecified chronicity   THERAPY DIAG:  No diagnosis found.  RATIONALE FOR EVALUATION AND TREATMENT: Rehabilitation  ONSET DATE: 2 weeks ago  NEXT MD VISIT:    SUBJECTIVE:                                                                                                                                                                                                         SUBJECTIVE STATEMENT: 38 y/o referred to PT for acute LBP with LLE radiculopathy.   Saw PCP 2 weeks ago for the pain and started medrol  dose pack and robaxin  and is feeling some better.   No imaging at this time.  Pain is in the central low back and in the lateral L calf from knee to ankle.  The back pain and the L calf pain are now intermittent and only with lumbar flexion.  Initially the pain was constant.   Patient reports pain started about 2 weeks ago after she played 7 hrs of pickleball.  She is competitively and continued to play in a  tournament afterward.   She is having pain in the low back .  Pain is worse with sneeze and cough.   Sitting and car ride is the worst pain.  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    Flexion increases pain.  Walking is better.  She is able to sleep ok, so has not been taking the robaxin .   She also reports she fell during her pickelball  tournament when shuffling laterally to the R  when she rolled the R ankle and fell on her R side.   States the fall Did not worsen her back pain.  She reports not pregnant or trying to become pregnant.   She states it is not possible for her to be pregnant at this time.  PAIN: Are you having pain? Yes: NPRS scale: 0/10 at times; 2/10 now; 4-5/10 worst over last 5 days Pain location: low back Pain description: aching of variable intensity;  can be sharp with bending forward Aggravating factors: flexion; sitting long periods Relieving factors: being up walking  PERTINENT HISTORY:  HTN, hyperglycemia, thoracic pain, R knee pain, L achilles strain  PRECAUTIONS: None  RED FLAGS: None  WEIGHT BEARING RESTRICTIONS: No  FALLS:  Has patient fallen in last 6 months? Yes. 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   PLOF: Independent with gait  PATIENT GOALS: get rid of the pain, continue playing pickleball   OBJECTIVE: (objective measures completed at initial evaluation unless otherwise dated)  DIAGNOSTIC FINDINGS:  N/a   PATIENT SURVEYS:  ODI = 14 / 50 = 28.0 %  SCREENING FOR RED FLAGS: Bowel or bladder incontinence: No Spinal tumors: No Cauda equina syndrome: No Compression  fracture: No Abdominal aneurysm: No  COGNITION:  Overall cognitive status: Within functional limits for tasks assessed    SENSATION: WFL  POSTURE:  weight shift right/right lateral shift is present  PALPATION: Some TTP over paraspinals  LUMBAR ROM:   Active  Eval  Flexion To knees;  increases pain!  Extension 75%;  sharp end range pain  Right lateral flexion To knee jt line; repeated motion increases pain  Left lateral flexion To mid thigh; repeated increases pain  Right rotation 90%; no change w/ repeated motion  Left rotation 100%; no change repeated motion  (Blank rows = not tested)  MUSCLE LENGTH: Hamstrings: Right:  SLR = 85 deg back pain at end range;   Left SLR 70 deg with pain in the low back Hamstrings: likely not tight, but limited ROM due to back pain/radiculopathy ITB: NT Piriformis: not tight Hip flexors: not tight Quads: NT Heelcord: NT  LOWER EXTREMITY ROM:     Active  Right eval Left eval  Hip flexion    Hip extension    Hip abduction    Hip adduction    Hip internal rotation    Hip external rotation    Knee flexion    Knee extension    Ankle dorsiflexion    Ankle plantarflexion    Ankle inversion    Ankle eversion    (Blank rows = not tested)  LOWER EXTREMITY MMT:    MMT Right eval Left eval  Hip flexion 4; p! 4; p!  Hip extension 4-; p! 4-; p!  Hip abduction 4 4-  Hip adduction    Hip internal rotation    Hip external rotation    Knee flexion 5 5  Knee extension 5 5  Ankle dorsiflexion 4+ 4+  Ankle plantarflexion 5 5  Ankle inversion    Ankle eversion     (Blank rows = not tested)  LUMBAR SPECIAL TESTS:  Straight leg raise test:  Positive, Slump test: Positive, SI Compression/distraction test: Negative, and FABER test: Negative  FUNCTIONAL TESTS:  TBD  GAIT: Distance walked: unlimited Assistive device utilized: None Level of assistance: Complete Independence Gait pattern:   Comments: no limp, more ankle supination on the  R; normal stride length, some decrease in trunk rotation likely due to pain; R shoulder is lower; some R lateral shift   TODAY'S TREATMENT:  03/08/24 SELF CARE: Provided education on PT POC progression; initial HEP   PATIENT EDUCATION:  Education details: PT eval findings, anticipated POC, and initial HEP  Person educated: Patient Education method: Explanation, Demonstration, Verbal cues, Tactile cues, and Handouts Education comprehension: verbalized understanding, verbal cues required, tactile cues required, and needs further education  HOME EXERCISE PROGRAM: Access Code: [email or web address] URL: [email or web address] Date: 03/08/2024 Prepared by: Garnette Montclair  Exercises - Right Standing Lateral Shift Correction at Wall - Repetitions  - 1 x daily - 7 x weekly - 3 sets - 10 reps - Static Prone on Elbows  - 1 x daily - 7 x weekly - 1 sets - 10 reps - Prone Push Ups on Forearms  - 1 x daily - 7 x weekly - 1 sets - 10 reps - Standing Lumbar Extension  - 1 x daily - 7 x weekly - 1 sets - 10 reps - Standing Hip Extension  - 1 x daily - 7 x weekly - 3 sets - 10 reps - Plank with Elbows on Table  - 1 x daily - 7 x weekly - 3 sets - 10 reps - Standing Side Plank on Wall  - 1 x daily - 7 x weekly - 3 sets - 10 reps   ASSESSMENT:  CLINICAL IMPRESSION: Angela Pollard is a 38 y.o. female who was referred to physical therapy for evaluation and treatment for LBP with LLE radiculopathy.   Patient reports onset of low back and LLE pain beginning 2-3 weeks ago after playing pickleball all day for 7 hrs.  She felt some pain afterwards but was in a tournament shortly thereafter and didn't want to cancel so played in that in the following days.  Pain became severe in the low back and also the L lateral calf.   She is feeling a little better now with meds from PCP, but is still having pain. Pain is worse with lumbar flexion and prolonged sitting such as her car rides to work at [employer].  She does have a R lateral shift present.  SLR and slump tests are positive.  She wants to continue to play pickleball if able, but this may flare up her pain again.   Patient has deficits in lumbar ROM, lumbar LE flexibility, hip/back strength, abnormal posture, and pain which are interfering with ADLs and are impacting quality of life.  On Modified Oswestry patient scored 14/50 demonstrating 28%  disability.  Angela Pollard will benefit from skilled PT to address above deficits to improve mobility and activity tolerance with decreased pain interference.     OBJECTIVE IMPAIRMENTS: decreased ROM, decreased strength, postural dysfunction, and pain.   ACTIVITY LIMITATIONS: lifting, bending, sitting, and squatting  PARTICIPATION LIMITATIONS: cleaning, laundry, driving, and sports  PERSONAL FACTORS: Fitness and 1-2 comorbidities: HTN, hyperglycemia, thoracic pain, R knee pain, L achilles strain are also affecting patient's functional outcome.   REHAB POTENTIAL: Good  CLINICAL DECISION MAKING: Stable/uncomplicated  EVALUATION COMPLEXITY: Moderate   GOALS: Goals reviewed with patient? Yes  SHORT TERM GOALS: Target date: 04/05/2024   Patient will be independent with  initial HEP to improve outcomes and carryover.  Baseline: 100% PT assist required for correct completion Goal status: INITIAL  2.  Patient will report 25% improvement in low back pain to improve QOL. Baseline: 5/10 worst Goal status: IN PROGRESS  LONG TERM GOALS: Target date: 05/03/2024   Patient will be independent with ongoing/advanced HEP for self-management at home.  Baseline: no advanced HEP yet Goal status: INITIAL  2.  Patient will report 50-75% improvement in low back pain to improve QOL.  Baseline: 5/10 worst Goal status: INITIAL  3.  Patient to demonstrate ability to achieve and maintain good spinal alignment/posturing and body mechanics needed for daily activities. Baseline: R lateral shift Goal status:  INITIAL  4.  Patient will demonstrate full pain free lumbar ROM to perform ADLs.   Baseline: Refer to above lumbar ROM table Goal status: INITIAL  5.  Patient will demonstrate improved BLE strength to >/= 5/5 for improved stability and ease of mobility. Baseline: Refer to above LE MMT table Goal status: INITIAL  6. Patient will report </= 15% on Modified Oswestry (MCID = 12%) to demonstrate improved functional ability with decreased pain interference. Baseline: 28% Goal status: INITIAL  7.  Patient will tolerate 1 hour of sitting w/o increased pain to allow for driving to work  Baseline: 30 min drive to work is very painful and difficult to get out of car Goal status: INITIAL  8.  Patient will report centralization of radicular symptoms.  Baseline: Has pain/paresthesia from L knee to L ankl lateral aspec Goal status: INITIAL  PLAN:  PT FREQUENCY: 1-2x/week  PT DURATION: 8 weeks  PLANNED INTERVENTIONS: 97164- PT Re-evaluation, 97750- Physical Performance Testing, 97110-Therapeutic exercises, 97530- Therapeutic activity, [id number]- Neuromuscular re-education, 97535- Self Care, 02859- Manual therapy, [id number]- Gait training, [phone]- Electrical stimulation (unattended), 97016- Vasopneumatic device, [id number]- Ultrasound, [id number]- Traction (mechanical), [id number]- Ionotophoresis 4mg /ml Dexamethasone, 79439 (1-2 muscles), 20561 (3+ muscles)- Dry Needling, Patient/Family education, Balance training, Stair training, Taping, Joint mobilization, Spinal mobilization, Cryotherapy, and Moist heat  PLAN FOR NEXT SESSION: review and assess how lateral shift correction and extension exercises did for HEP; add some core strengthening; continue with modalities, taping PRN for pain   Treon Kehl, PT 03/08/2024, 9:57 AM

## 2024-03-13 ENCOUNTER — Ambulatory Visit

## 2024-03-13 ENCOUNTER — Ambulatory Visit: Admitting: Physical Therapy

## 2024-03-13 DIAGNOSIS — M5442 Lumbago with sciatica, left side: Secondary | ICD-10-CM | POA: Diagnosis not present

## 2024-03-13 DIAGNOSIS — M5459 Other low back pain: Secondary | ICD-10-CM | POA: Diagnosis not present

## 2024-03-13 DIAGNOSIS — R293 Abnormal posture: Secondary | ICD-10-CM | POA: Diagnosis not present

## 2024-03-13 DIAGNOSIS — M6281 Muscle weakness (generalized): Secondary | ICD-10-CM

## 2024-03-13 NOTE — Therapy (Addendum)
 OUTPATIENT PHYSICAL THERAPY THORACOLUMBAR TREATMENT / DC SUMMARY   Patient Name: Angela Pollard MRN: [id number] DOB:1985/12/28, 38 y.o., female Today's Date: 03/13/2024  END OF SESSION:  PT End of Session - 03/13/24 1526     Visit Number 2    Date for PT Re-Evaluation 05/03/24    PT Start Time 1449    PT Stop Time 1530    PT Time Calculation (min) 41 min    Activity Tolerance Patient tolerated treatment well;No increased pain    Behavior During Therapy [facility] for tasks assessed/performed          Past Medical History:  Diagnosis Date   Hyperglycemia    Hypertension    x 4-5 years   Infertility, female 02/20/2020   Past Surgical History:  Procedure Laterality Date   CESAREAN SECTION  09/2020   NO PAST SURGERIES     Patient Active Problem List   Diagnosis Date Noted   Midline low back pain with left-sided sciatica 02/22/2024   Vulvar cyst 11/15/2023   Strain of Achilles tendon, left, initial encounter 04/26/2023   Right knee pain 04/26/2023   Overweight 09/08/2022   Anxiety 06/09/2022   Hyperglycemia 06/09/2022   Thoracic back pain 05/25/2022   Preventative health care 05/15/2021   Essential hypertension 01/02/2019    PCP: Daryl Setter, NP   REFERRING PROVIDER: Daryl Setter, NP   REFERRING DIAG: [id number] (ICD-10-CM) - Midline low back pain with left-sided sciatica, unspecified chronicity   THERAPY DIAG:  Other low back pain  Muscle weakness (generalized)  Abnormal posture  RATIONALE FOR EVALUATION AND TREATMENT: Rehabilitation  ONSET DATE: 2 weeks ago  NEXT MD VISIT:    SUBJECTIVE:                                                                                                                                                                                                         SUBJECTIVE STATEMENT: Pt reports slightly increased pain today on L side of back, right on top of pelvis,   She reports not pregnant or trying to become  pregnant.   She states it is not possible for her to be pregnant at this time.  PAIN: Are you having pain? Yes: NPRS scale: 0/10 at times; 3/10 now; 4-5/10 worst over last 5 days Pain location: low back Pain description: aching of variable intensity;  can be sharp with bending forward Aggravating factors: flexion; sitting long periods Relieving factors: being up walking  PERTINENT HISTORY:  HTN, hyperglycemia, thoracic pain, R knee pain, L achilles strain  PRECAUTIONS: None  RED  FLAGS: None  WEIGHT BEARING RESTRICTIONS: No  FALLS:  Has patient fallen in last 6 months? Yes. 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   PLOF: Independent with gait  PATIENT GOALS: get rid of the pain, continue playing pickleball   OBJECTIVE: (objective measures completed at initial evaluation unless otherwise dated)  DIAGNOSTIC FINDINGS:  N/a   PATIENT SURVEYS:  ODI = 14 / 50 = 28.0 %  SCREENING FOR RED FLAGS: Bowel or bladder incontinence: No Spinal tumors: No Cauda equina syndrome: No Compression fracture: No Abdominal aneurysm: No  COGNITION:  Overall cognitive status: Within functional limits for tasks assessed    SENSATION: WFL  POSTURE:  weight shift right/right lateral shift is present  PALPATION: Some TTP over paraspinals  LUMBAR ROM:   Active  Eval  Flexion To knees;  increases pain!  Extension 75%;  sharp end range pain  Right lateral flexion To knee jt line; repeated motion increases pain  Left lateral flexion To mid thigh; repeated increases pain  Right rotation 90%; no change w/ repeated motion  Left rotation 100%; no change repeated motion   (Blank rows = not tested)  MUSCLE LENGTH: Hamstrings: Right:  SLR = 85 deg back pain at end range;   Left SLR 70 deg with pain in the low back Hamstrings: likely not tight, but limited ROM due to back pain/radiculopathy ITB: NT Piriformis: not tight Hip flexors: not tight Quads: NT Heelcord: NT  LOWER EXTREMITY ROM:     Active  Right eval Left eval  Hip flexion    Hip extension    Hip abduction    Hip adduction    Hip internal rotation    Hip external rotation    Knee flexion    Knee extension    Ankle dorsiflexion    Ankle plantarflexion    Ankle inversion    Ankle eversion    (Blank rows = not tested)  LOWER EXTREMITY MMT:    MMT Right eval Left eval  Hip flexion 4; p! 4; p!  Hip extension 4-; p! 4-; p!  Hip abduction 4 4-  Hip adduction    Hip internal rotation    Hip external rotation    Knee flexion 5 5  Knee extension 5 5  Ankle dorsiflexion 4+ 4+  Ankle plantarflexion 5 5  Ankle inversion    Ankle eversion     (Blank rows = not tested)  LUMBAR SPECIAL TESTS:  Straight leg raise test: Positive, Slump test: Positive, SI Compression/distraction test: Negative, and FABER test: Negative  FUNCTIONAL TESTS:  TBD  GAIT: Distance walked: unlimited Assistive device utilized: None Level of assistance: Complete Independence Gait pattern:   Comments: no limp, more ankle supination on the R; normal stride length, some decrease in trunk rotation likely due to pain; R shoulder is lower; some R lateral shift   TODAY'S TREATMENT:  03/13/24 Bike L10x16min Lateral shift correct R x 10 Standing hip extension x 10 BLE Modified plank on mat table x 20 seconds- challenging Kneeling plank on mat table  POE reviewed Prone press ups x 10 Side plank elbow to knee x 10 Standing posterior pelvis rotation self mob   03/08/24 SELF CARE:  Provided education on PT POC progression; initial HEP   PATIENT EDUCATION:  Education details: PT eval findings, anticipated POC,  and initial HEP  Person educated: Patient Education method: Explanation, Demonstration, Verbal cues, Tactile cues, and Handouts Education comprehension: verbalized understanding, verbal cues required, tactile cues required, and needs further education  HOME EXERCISE PROGRAM: Access Code: [email or web address] URL: [email or web address] Date: 03/08/2024 Prepared by: Garnette Montclair  Exercises - Right Standing Lateral Shift Correction at Wall - Repetitions  - 1 x daily - 7 x weekly - 3 sets - 10 reps - Static Prone on Elbows  - 1 x daily - 7 x weekly - 1 sets - 10 reps - Prone Push Ups on Forearms  - 1 x daily - 7 x weekly - 1 sets - 10 reps - Standing Lumbar Extension  - 1 x daily - 7 x weekly - 1 sets - 10 reps - Standing Hip Extension  - 1 x daily - 7 x weekly - 3 sets - 10 reps - Plank with Elbows on Table  - 1 x daily - 7 x weekly - 3 sets - 10 reps - Standing Side Plank on Wall  - 1 x daily - 7 x weekly - 3 sets - 10 reps   ASSESSMENT:  CLINICAL IMPRESSION: Reviewed HEP with good response, continues progressing core strengthening exercises to patient's tolerance. She reported pain today along R iliac crest down to piriformis, responded well to posterior pelvic rotation exercise today.   Raiyah will benefit from skilled PT to address above deficits to improve mobility and activity tolerance with decreased pain interference.     OBJECTIVE IMPAIRMENTS: decreased ROM, decreased strength, postural dysfunction, and pain.   ACTIVITY LIMITATIONS: lifting, bending, sitting, and squatting  PARTICIPATION LIMITATIONS: cleaning, laundry, driving, and sports  PERSONAL FACTORS: Fitness and 1-2 comorbidities: HTN, hyperglycemia, thoracic pain, R knee pain, L achilles strain are also affecting patient's functional outcome.   REHAB POTENTIAL: Good  CLINICAL DECISION MAKING: Stable/uncomplicated  EVALUATION COMPLEXITY: Moderate   GOALS: Goals reviewed with patient? Yes  SHORT TERM  GOALS: Target date: 04/05/2024   Patient will be independent with initial HEP to improve outcomes and carryover.  Baseline: 100% PT assist required for correct completion Goal status: INITIAL  2.  Patient will report 25% improvement in low back pain to improve QOL. Baseline: 5/10 worst Goal status: IN PROGRESS  LONG TERM GOALS: Target date: 05/03/2024   Patient will be independent with ongoing/advanced HEP for self-management at home.  Baseline: no advanced HEP yet Goal status: INITIAL  2.  Patient will report 50-75% improvement in low back pain to improve QOL.  Baseline: 5/10 worst Goal status: INITIAL  3.  Patient to demonstrate ability to achieve and maintain good spinal alignment/posturing and body mechanics needed for daily activities. Baseline: R lateral shift Goal status: INITIAL  4.  Patient will demonstrate full pain free lumbar ROM to perform ADLs.   Baseline: Refer to above lumbar ROM table Goal status: INITIAL  5.  Patient will demonstrate improved BLE strength to >/= 5/5 for improved stability and ease of mobility. Baseline: Refer to above LE MMT table Goal status: INITIAL  6. Patient will report </= 15% on Modified Oswestry (MCID = 12%) to demonstrate improved functional ability with decreased pain interference. Baseline: 28% Goal status: INITIAL  7.  Patient will tolerate 1 hour of sitting w/o increased pain to allow for driving to work  Baseline: 30 min drive to work is very painful and difficult to  get out of car Goal status: INITIAL  8.  Patient will report centralization of radicular symptoms.  Baseline: Has pain/paresthesia from L knee to L ankl lateral aspec Goal status: INITIAL  PLAN:  PT FREQUENCY: 1-2x/week  PT DURATION: 8 weeks  PLANNED INTERVENTIONS: 02835- PT Re-evaluation, 97750- Physical Performance Testing, 97110-Therapeutic exercises, 97530- Therapeutic activity, [id number]- Neuromuscular re-education, 97535- Self Care, 02859- Manual  therapy, [phone]- Gait training, [phone]- Electrical stimulation (unattended), 97016- Vasopneumatic device, [id number]- Ultrasound, [id number]- Traction (mechanical), [id number]- Ionotophoresis 4mg /ml Dexamethasone, 79439 (1-2 muscles), 20561 (3+ muscles)- Dry Needling, Patient/Family education, Balance training, Stair training, Taping, Joint mobilization, Spinal mobilization, Cryotherapy, and Moist heat  PLAN FOR NEXT SESSION: add some core strengthening; continue with modalities, taping PRN for pain  PHYSICAL THERAPY DISCHARGE SUMMARY  Visits from Start of Care: 2  Current functional level related to goals / functional outcomes: Unchanged from initial visit   Remaining deficits: unchanged   Education / Equipment: Patient is independent with all home exercises and advised to continue daily as tolerated and call us  with any questions   Patient agrees to discharge. Patient goals were not met. Patient is being discharged due to not returning since the last visit.   Gurveer Colucci L Xaine Sansom, PTA 03/13/2024, 3:34 PM

## 2024-03-21 ENCOUNTER — Ambulatory Visit

## 2024-03-23 ENCOUNTER — Encounter

## 2024-03-27 ENCOUNTER — Ambulatory Visit

## 2024-03-28 ENCOUNTER — Encounter

## 2024-03-30 ENCOUNTER — Encounter: Admitting: Rehabilitation

## 2024-04-02 ENCOUNTER — Encounter: Admitting: Rehabilitation

## 2024-04-05 ENCOUNTER — Ambulatory Visit: Attending: Family | Admitting: Rehabilitation

## 2024-07-26 ENCOUNTER — Other Ambulatory Visit (HOSPITAL_BASED_OUTPATIENT_CLINIC_OR_DEPARTMENT_OTHER): Payer: Self-pay
